# Patient Record
Sex: Male | Born: 1945 | Race: Black or African American | Hispanic: No | Smoking: Never smoker
Health system: Southern US, Community
[De-identification: ages and names within clinical notes are randomized; demographics above are authoritative.]

## PROBLEM LIST (undated history)

## (undated) ENCOUNTER — Emergency Department (HOSPITAL_COMMUNITY): Payer: Medicare Other | Source: Home / Self Care

## (undated) DIAGNOSIS — E118 Type 2 diabetes mellitus with unspecified complications: Secondary | ICD-10-CM

## (undated) DIAGNOSIS — N179 Acute kidney failure, unspecified: Secondary | ICD-10-CM

## (undated) DIAGNOSIS — E785 Hyperlipidemia, unspecified: Secondary | ICD-10-CM

## (undated) DIAGNOSIS — K219 Gastro-esophageal reflux disease without esophagitis: Secondary | ICD-10-CM

## (undated) DIAGNOSIS — I10 Essential (primary) hypertension: Secondary | ICD-10-CM

## (undated) DIAGNOSIS — E119 Type 2 diabetes mellitus without complications: Secondary | ICD-10-CM

## (undated) DIAGNOSIS — N189 Chronic kidney disease, unspecified: Secondary | ICD-10-CM

## (undated) HISTORY — DX: Hyperlipidemia, unspecified: E78.5

## (undated) HISTORY — DX: Gastro-esophageal reflux disease without esophagitis: K21.9

## (undated) HISTORY — PX: NO PAST SURGERIES: SHX2092

## (undated) HISTORY — PX: KIDNEY SURGERY: SHX687

## (undated) HISTORY — DX: Chronic kidney disease, unspecified: N18.9

## (undated) HISTORY — PX: LITHOTRIPSY: SUR834

---

## 1998-05-30 ENCOUNTER — Encounter: Admission: RE | Admit: 1998-05-30 | Discharge: 1998-08-28 | Payer: Self-pay | Admitting: Unknown Physician Specialty

## 1999-06-14 ENCOUNTER — Ambulatory Visit (HOSPITAL_COMMUNITY): Admission: RE | Admit: 1999-06-14 | Discharge: 1999-06-14 | Payer: Self-pay | Admitting: Unknown Physician Specialty

## 2003-08-20 ENCOUNTER — Encounter: Admission: RE | Admit: 2003-08-20 | Discharge: 2003-08-20 | Payer: Self-pay | Admitting: Nephrology

## 2003-08-20 ENCOUNTER — Encounter: Payer: Self-pay | Admitting: Nephrology

## 2009-04-25 ENCOUNTER — Emergency Department (HOSPITAL_COMMUNITY): Admission: EM | Admit: 2009-04-25 | Discharge: 2009-04-25 | Payer: Self-pay | Admitting: Emergency Medicine

## 2013-07-08 ENCOUNTER — Emergency Department (HOSPITAL_COMMUNITY)
Admission: EM | Admit: 2013-07-08 | Discharge: 2013-07-08 | Disposition: A | Discharge status: Home / Self Care | Payer: PRIVATE HEALTH INSURANCE | Attending: Emergency Medicine | Admitting: Emergency Medicine

## 2013-07-08 ENCOUNTER — Encounter (HOSPITAL_COMMUNITY): Payer: Self-pay | Admitting: Emergency Medicine

## 2013-07-08 DIAGNOSIS — R7309 Other abnormal glucose: Secondary | ICD-10-CM | POA: Insufficient documentation

## 2013-07-08 DIAGNOSIS — Z794 Long term (current) use of insulin: Secondary | ICD-10-CM | POA: Insufficient documentation

## 2013-07-08 DIAGNOSIS — R109 Unspecified abdominal pain: Secondary | ICD-10-CM | POA: Insufficient documentation

## 2013-07-08 DIAGNOSIS — E119 Type 2 diabetes mellitus without complications: Secondary | ICD-10-CM | POA: Insufficient documentation

## 2013-07-08 DIAGNOSIS — Z79899 Other long term (current) drug therapy: Secondary | ICD-10-CM | POA: Insufficient documentation

## 2013-07-08 DIAGNOSIS — R5381 Other malaise: Secondary | ICD-10-CM | POA: Insufficient documentation

## 2013-07-08 DIAGNOSIS — R5383 Other fatigue: Secondary | ICD-10-CM | POA: Insufficient documentation

## 2013-07-08 DIAGNOSIS — R531 Weakness: Secondary | ICD-10-CM

## 2013-07-08 DIAGNOSIS — R42 Dizziness and giddiness: Secondary | ICD-10-CM | POA: Insufficient documentation

## 2013-07-08 DIAGNOSIS — I10 Essential (primary) hypertension: Secondary | ICD-10-CM | POA: Insufficient documentation

## 2013-07-08 HISTORY — DX: Essential (primary) hypertension: I10

## 2013-07-08 HISTORY — DX: Type 2 diabetes mellitus without complications: E11.9

## 2013-07-08 LAB — CBC WITH DIFFERENTIAL/PLATELET
Basophils Relative: 0 % (ref 0–1)
Eosinophils Absolute: 0.2 10*3/uL (ref 0.0–0.7)
Eosinophils Relative: 2 % (ref 0–5)
HCT: 37.8 % — ABNORMAL LOW (ref 39.0–52.0)
Hemoglobin: 12.3 g/dL — ABNORMAL LOW (ref 13.0–17.0)
Lymphocytes Relative: 32 % (ref 12–46)
Monocytes Absolute: 1 10*3/uL (ref 0.1–1.0)
Monocytes Relative: 16 % — ABNORMAL HIGH (ref 3–12)
Neutro Abs: 3.3 10*3/uL (ref 1.7–7.7)
RBC: 4.27 MIL/uL (ref 4.22–5.81)
RDW: 13 % (ref 11.5–15.5)
WBC: 6.6 10*3/uL (ref 4.0–10.5)

## 2013-07-08 LAB — URINALYSIS, ROUTINE W REFLEX MICROSCOPIC
Ketones, ur: NEGATIVE mg/dL
Nitrite: NEGATIVE
pH: 6 (ref 5.0–8.0)

## 2013-07-08 LAB — COMPREHENSIVE METABOLIC PANEL
ALT: 29 U/L (ref 0–53)
CO2: 28 mEq/L (ref 19–32)
Creatinine, Ser: 1.68 mg/dL — ABNORMAL HIGH (ref 0.50–1.35)
Total Bilirubin: 0.5 mg/dL (ref 0.3–1.2)
Total Protein: 8.6 g/dL — ABNORMAL HIGH (ref 6.0–8.3)

## 2013-07-08 LAB — GLUCOSE, CAPILLARY
Glucose-Capillary: 156 mg/dL — ABNORMAL HIGH (ref 70–99)
Glucose-Capillary: 186 mg/dL — ABNORMAL HIGH (ref 70–99)

## 2013-07-08 LAB — URINE MICROSCOPIC-ADD ON

## 2013-07-08 LAB — LIPASE, BLOOD: Lipase: 19 U/L (ref 11–59)

## 2013-07-08 NOTE — ED Provider Notes (Signed)
History    CSN: [id number] Arrival date & time 07/08/13  1212  First MD Initiated Contact with Patient 07/08/13 1351     Chief Complaint  Patient presents with  . blood sugar drop   . Dizziness  . Weakness  . Abdominal Pain   (Consider location/radiation/quality/duration/timing/severity/associated sxs/prior Treatment) Patient is a 67 y.o. male presenting with weakness and abdominal pain. The history is provided by the patient. No language interpreter was used.  Weakness The current episode started in the past 7 days. Associated symptoms include abdominal pain and weakness. Pertinent negatives include no chest pain, chills, coughing, fever, headaches, nausea or vomiting. Associated symptoms comments: He has been having episodes generalized weakness, lightheadedness, feeling like he was going to pass out that are associated with blood sugar drops as low as 28. He went for evaluation by his PCP last week and his insulin dosing was decreased without any significant improvement in the frequency of blood sugar drops. He denies falls or injury. He has right sided abdominal discomfort but denies N, V, change in bowel habit. No fevers, dysuria..  Abdominal Pain Associated symptoms include abdominal pain and weakness. Pertinent negatives include no chest pain, chills, coughing, fever, headaches, nausea or vomiting.   Past Medical History  Diagnosis Date  . Diabetes mellitus without complication   . Hypertension    History reviewed. No pertinent past surgical history. No family history on file. History  Substance Use Topics  . Smoking status: Never Smoker   . Smokeless tobacco: Never Used  . Alcohol Use: No    Review of Systems  Constitutional: Negative for fever and chills.  HENT: Negative.   Respiratory: Negative.  Negative for cough and shortness of breath.   Cardiovascular: Negative.  Negative for chest pain.  Gastrointestinal: Positive for abdominal pain. Negative for nausea,  vomiting and diarrhea.  Genitourinary: Negative.  Negative for dysuria.  Musculoskeletal: Negative.   Skin: Negative.   Neurological: Positive for weakness. Negative for syncope and headaches.    Allergies  Bee venom  Home Medications   Current Outpatient Rx  Name  Route  Sig  Dispense  Refill  . haloperidol (HALDOL) 5 MG tablet   Oral   Take 5-10 mg by mouth 2 (two) times daily. 1 tab in am, 2 tabs in pm         . insulin aspart protamine- aspart (NOVOLOG MIX 70/30) (70-30) 100 UNIT/ML injection   Subcutaneous   Inject 28-33 Units into the skin 2 (two) times daily with a meal. 33u in am, 28u in pm         . lisinopril (PRINIVIL,ZESTRIL) 5 MG tablet   Oral   Take 5 mg by mouth daily.         . metoprolol (LOPRESSOR) 100 MG tablet   Oral   Take 100 mg by mouth 2 (two) times daily.         . pravastatin (PRAVACHOL) 40 MG tablet   Oral   Take 40 mg by mouth at bedtime.          BP 155/91  Pulse 72  Temp(Src) 97.9 F (36.6 C) (Oral)  Resp 18  SpO2 100% Physical Exam  Constitutional: He is oriented to person, place, and time. He appears well-developed and well-nourished.  HENT:  Head: Normocephalic.  Neck: Normal range of motion. Neck supple.  Cardiovascular: Normal rate and regular rhythm.   Pulmonary/Chest: Effort normal and breath sounds normal.  Abdominal: Soft. Bowel sounds are normal. There  is no tenderness. There is no rebound and no guarding.  Mildly tender right abdomen without guarding or rebound. No localization to upper or lower abdomen. BS positive.   Musculoskeletal: Normal range of motion.  Neurological: He is alert and oriented to person, place, and time.  Skin: Skin is warm and dry. No rash noted.  Psychiatric: He has a normal mood and affect.    ED Course  Procedures (including critical care time) Labs Reviewed  GLUCOSE, CAPILLARY - Abnormal; Notable for the following:    Glucose-Capillary 156 (*)    All other components within  normal limits  CBC WITH DIFFERENTIAL - Abnormal; Notable for the following:    Hemoglobin 12.3 (*)    HCT 37.8 (*)    Monocytes Relative 16 (*)    All other components within normal limits  COMPREHENSIVE METABOLIC PANEL - Abnormal; Notable for the following:    Glucose, Bld 159 (*)    BUN 25 (*)    Creatinine, Ser 1.68 (*)    Total Protein 8.6 (*)    GFR calc non Af Amer 40 (*)    GFR calc Af Amer 47 (*)    All other components within normal limits  URINALYSIS, ROUTINE W REFLEX MICROSCOPIC - Abnormal; Notable for the following:    APPearance CLOUDY (*)    Protein, ur 30 (*)    Leukocytes, UA TRACE (*)    All other components within normal limits  URINE MICROSCOPIC-ADD ON - Abnormal; Notable for the following:    Bacteria, UA FEW (*)    All other components within normal limits  URINE CULTURE  LIPASE, BLOOD   No results found. No diagnosis found. 1. Weakness 2. Diabetes Type II MDM  Dr. Effie Shy has seen this patient. Discussed decreasing insulin dosing, dietary education, close primary care f/u. Stable for discharge.    Arnoldo Hooker, PA-C 07/08/13 1553

## 2013-07-08 NOTE — ED Notes (Signed)
Pt here today c/o of intermittent blood sugar drops, weakness that has been going on for a couple weeks.  Pt also c/o abd pain that has been going on for month.  Pt's son feels that he needs better eval of meds and physical well being bc he doesn't feel pt's PCP is doing a good job with changing his meds due to frequent blood sugar level drops and pts weakness and near syncope couple of times in past few weeks.

## 2013-07-08 NOTE — ED Provider Notes (Signed)
[name] [name] is a 67 y.o. male who is here for erratic blood sugars. He has not been monitoring his carbohydrate intake. His in the care provider, decreased his insulin dosing, minimally by 2 units, each dose, last week. He denies fever, chills, nausea, vomiting. He has occasional during sedation. His right lateral abdominal wall.  Exam alert, calm, cooperative. He is lucid and interactive. He is not in respiratory distress.  Assessment: Erratic blood sugars likely secondary to nutritional indiscretion. Mild renal insufficiency without comparison last available.  Plan: He is given brief verbal instructions and written instructions on appropriate carbohydrate intake. We'll decrease his insulin dosing by 50% until he can see his PCP in 2 days.  Medical screening examination/treatment/procedure(s) were conducted as a shared visit with non-physician practitioner(s) and myself.  I personally evaluated the patient during the encounter  Flint Melter, MD 07/09/13 [phone]

## 2013-07-08 NOTE — ED Notes (Signed)
Bed:[facility]<BR> Expected date:<BR> Expected time:<BR> Means of arrival:<BR> Comments:<BR> [place]

## 2013-07-09 LAB — URINE CULTURE: Colony Count: NO GROWTH

## 2013-11-15 ENCOUNTER — Emergency Department (HOSPITAL_COMMUNITY)
Admission: EM | Admit: 2013-11-15 | Discharge: 2013-11-15 | Disposition: A | Discharge status: Home / Self Care | Payer: PRIVATE HEALTH INSURANCE | Attending: Emergency Medicine | Admitting: Emergency Medicine

## 2013-11-15 ENCOUNTER — Ambulatory Visit (INDEPENDENT_AMBULATORY_CARE_PROVIDER_SITE_OTHER): Payer: Medicare Other | Admitting: Emergency Medicine

## 2013-11-15 ENCOUNTER — Emergency Department (HOSPITAL_COMMUNITY): Payer: PRIVATE HEALTH INSURANCE

## 2013-11-15 ENCOUNTER — Encounter (HOSPITAL_COMMUNITY): Payer: Self-pay | Admitting: Emergency Medicine

## 2013-11-15 VITALS — BP 188/80 | HR 72 | Temp 98.8°F | Resp 24

## 2013-11-15 DIAGNOSIS — Z79899 Other long term (current) drug therapy: Secondary | ICD-10-CM | POA: Insufficient documentation

## 2013-11-15 DIAGNOSIS — R079 Chest pain, unspecified: Secondary | ICD-10-CM

## 2013-11-15 DIAGNOSIS — I10 Essential (primary) hypertension: Secondary | ICD-10-CM | POA: Insufficient documentation

## 2013-11-15 DIAGNOSIS — R0789 Other chest pain: Secondary | ICD-10-CM | POA: Insufficient documentation

## 2013-11-15 DIAGNOSIS — R0602 Shortness of breath: Secondary | ICD-10-CM | POA: Insufficient documentation

## 2013-11-15 DIAGNOSIS — Z794 Long term (current) use of insulin: Secondary | ICD-10-CM | POA: Insufficient documentation

## 2013-11-15 DIAGNOSIS — F29 Unspecified psychosis not due to a substance or known physiological condition: Secondary | ICD-10-CM | POA: Insufficient documentation

## 2013-11-15 DIAGNOSIS — E785 Hyperlipidemia, unspecified: Secondary | ICD-10-CM | POA: Insufficient documentation

## 2013-11-15 DIAGNOSIS — E119 Type 2 diabetes mellitus without complications: Secondary | ICD-10-CM

## 2013-11-15 DIAGNOSIS — M542 Cervicalgia: Secondary | ICD-10-CM | POA: Insufficient documentation

## 2013-11-15 DIAGNOSIS — R05 Cough: Secondary | ICD-10-CM

## 2013-11-15 LAB — POCT I-STAT TROPONIN I: Troponin i, poc: 0.02 ng/mL (ref 0.00–0.08)

## 2013-11-15 LAB — CBC
MCH: 29.4 pg (ref 26.0–34.0)
MCHC: 34.3 g/dL (ref 30.0–36.0)
Platelets: 217 10*3/uL (ref 150–400)
RBC: 4.05 MIL/uL — ABNORMAL LOW (ref 4.22–5.81)
RDW: 12.5 % (ref 11.5–15.5)

## 2013-11-15 LAB — BASIC METABOLIC PANEL
Calcium: 8.3 mg/dL — ABNORMAL LOW (ref 8.4–10.5)
GFR calc non Af Amer: 44 mL/min — ABNORMAL LOW (ref >90)
Sodium: 133 mEq/L — ABNORMAL LOW (ref 135–145)

## 2013-11-15 MED ORDER — ASPIRIN 81 MG PO CHEW
324.0000 mg | CHEWABLE_TABLET | Freq: Once | ORAL | Status: AC
  Administered 2013-11-15: 324 mg via ORAL

## 2013-11-15 MED ORDER — NITROGLYCERIN 0.4 MG SL SUBL
0.4000 mg | SUBLINGUAL_TABLET | Freq: Once | SUBLINGUAL | Status: AC
  Administered 2013-11-15: 0.4 mg via SUBLINGUAL

## 2013-11-15 NOTE — ED Provider Notes (Signed)
CSN: [id number]     Arrival date & time 11/15/13  1030 History   First MD Initiated Contact with Patient 11/15/13 1131     Chief Complaint  Patient presents with  . Chest Pain   (Consider location/radiation/quality/duration/timing/severity/associated sxs/prior Treatment) HPI Comments: Patient is a 67 year old male with a history of hyperlipidemia, hypertension, and diabetes mellitus who presents for chest pain with onset 8 PM yesterday evening. Patient states the chest pain has been constant since onset and located at his left breast. Pain is nonradiating and sharp in nature without any modifying factors. Symptoms preceded by shortness of breath and associated with pain on the left side of his neck. Patient denies associated fever, vision changes, jaw pain, cough, nausea or vomiting, numbness or tingling, and extremity weakness. FHx significant for ACS in father "in his 17's". Patient given 324mg  ASA and 1 SL nitro by EMS PTA; transferred from [facility].  Patient is a 67 y.o. male presenting with chest pain. The history is provided by the patient. No language interpreter was used.  Chest Pain Associated symptoms: shortness of breath   Associated symptoms: no fever, no nausea and not vomiting     Past Medical History  Diagnosis Date  . Diabetes mellitus without complication   . Hypertension    History reviewed. No pertinent past surgical history. No family history on file. History  Substance Use Topics  . Smoking status: Never Smoker   . Smokeless tobacco: Never Used  . Alcohol Use: No    Review of Systems  Constitutional: Negative for fever.  Respiratory: Positive for shortness of breath.   Cardiovascular: Positive for chest pain.  Gastrointestinal: Negative for nausea and vomiting.  Musculoskeletal: Positive for neck pain. Negative for neck stiffness.  All other systems reviewed and are negative.    Allergies  Bee venom  Home Medications   Current Outpatient Rx  Name   Route  Sig  Dispense  Refill  . amLODipine-benazepril (LOTREL) 10-20 MG per capsule   Oral   Take 1 capsule by mouth daily.         . haloperidol (HALDOL) 5 MG tablet   Oral   Take 5-10 mg by mouth 2 (two) times daily. 1 tab in am, 2 tabs in pm         . Insulin Detemir (LEVEMIR FLEXPEN) 100 UNIT/ML SOPN   Subcutaneous   Inject 32 Units into the skin every morning.         Marland Kitchen lisinopril (PRINIVIL,ZESTRIL) 5 MG tablet   Oral   Take 5 mg by mouth daily.         . metoprolol (LOPRESSOR) 100 MG tablet   Oral   Take 100 mg by mouth 2 (two) times daily.         . nitroGLYCERIN (NITROSTAT) 0.4 MG SL tablet   Sublingual   Place 0.4 mg under the tongue every 5 (five) minutes as needed for chest pain.         . Nutritional Supplements (DHEA PO)   Oral   Take 1 tablet by mouth daily.         Marland Kitchen OVER THE COUNTER MEDICATION   Oral   Take 1 tablet by mouth daily. diabetic nerve rejuvenator         . pravastatin (PRAVACHOL) 40 MG tablet   Oral   Take 40 mg by mouth every morning.           BP 153/77  Pulse 78  Temp(Src) 98  F (36.7 C) (Oral)  Resp 20  Wt 193 lb (87.544 kg)  SpO2 100%  Physical Exam  Nursing note and vitals reviewed. Constitutional: He is oriented to person, place, and time. He appears well-developed and well-nourished. No distress.  HENT:  Head: Normocephalic and atraumatic.  Mouth/Throat: Oropharynx is clear and moist. No oropharyngeal exudate.  Eyes: Conjunctivae and EOM are normal. Pupils are equal, round, and reactive to light. No scleral icterus.  Neck: Normal range of motion. Neck supple.  No carotid bruits appreciated b/l  Cardiovascular: Normal rate, regular rhythm, normal heart sounds and intact distal pulses.   Pulmonary/Chest: Effort normal and breath sounds normal. No respiratory distress. He has no wheezes. He has no rales. He exhibits no tenderness.  Abdominal: Soft. There is no tenderness. There is no rebound and no guarding.   Musculoskeletal: Normal range of motion.  Neurological: He is alert and oriented to person, place, and time.  Skin: Skin is warm and dry. No rash noted. He is not diaphoretic. No erythema. No pallor.  Psychiatric: He has a normal mood and affect. His behavior is normal.    ED Course  Procedures (including critical care time) Labs Review Labs Reviewed  CBC - Abnormal; Notable for the following:    RBC 4.05 (*)    Hemoglobin 11.9 (*)    HCT 34.7 (*)    All other components within normal limits  BASIC METABOLIC PANEL - Abnormal; Notable for the following:    Sodium 133 (*)    Glucose, Bld 370 (*)    Creatinine, Ser 1.56 (*)    Calcium 8.3 (*)    GFR calc non Af Amer 44 (*)    GFR calc Af Amer 51 (*)    All other components within normal limits  POCT I-STAT TROPONIN I   Imaging Review Dg Chest 2 View (if Patient Has Fever And/or Copd)  11/15/2013   CLINICAL DATA:  Chest pain, shortness of Breath, hypertension.  EXAM: CHEST - 2 VIEW  COMPARISON:  08/07/2012 by report only  FINDINGS: Blunting of the right lateral costophrenic angle which was described previously. Mild cardiomegaly. No focal infiltrate or overt edema. No pneumothorax. No effusion. Regional bones unremarkable.  IMPRESSION: Mild cardiomegaly.   Electronically Signed   By: Oley Balm M.D.   On: 11/15/2013 11:16    Date: 11/15/2013  Rate: 70  Rhythm: normal sinus rhythm  QRS Axis: left  Intervals: normal  ST/T Wave abnormalities: nonspecific T wave changes  Conduction Disutrbances:none  Narrative Interpretation: NSR with NS T wave changes; no STEMI  Old EKG Reviewed: unchanged I have personally reviewed and interpreted this EKG   MDM   1. Chest pain, atypical    Patient presents for chest pain that has been constant since 8pm yesterday evening. Patient transferred to ED today from [facility] for further evaluation of symptoms. Patient today is well and nontoxic appearing, hemodynamically stable, and afebrile. Risk  factors include DM and HTN. Cardiac work up today including EKG, CXR, and troponin are unremarkable. No electrolyte imbalance that would explain patient's symptoms.   Doubt ACS given atypical nature of symptoms, reassuring physical exam, and work up in ED today. Also doubt aortic dissection given hemodynamic stability, no known hx of CAD, and lack of mediastinal widening on CXR. Pain on physical exam appears to be MSK in nature as pain present also in L side of neck and worse with neck movement. Do not believe delta troponin indicated given that pain has been constant  for the last 16 hours. Patient stable for d/c today with cardiology follow up for further evaluation of symptoms and scheduling of outpatient stress test. Return precautions discussed and patient agreeable to plan with no unaddressed concerns.   Filed Vitals:   11/15/13 1037 11/15/13 1131  BP: 166/71 153/77  Pulse: 76 78  Temp: 98 F (36.7 C)   TempSrc: Oral   Resp: 20 20  Weight: 193 lb (87.544 kg)   SpO2: 100% 100%     Antony Madura, PA-C 11/19/13 1555

## 2013-11-15 NOTE — Progress Notes (Signed)
Subjective:  This chart was scribed for [name] Chris, MD by Carl Best, Medical Scribe. This patient was seen in Room 6 and the patient's care was started at 9:49 AM.   Patient ID: [name] [name], male    DOB: 02/06/1946, 67 y.o.   MRN: [id number]  HPI HPI Comments: [name] [name] is a 67 y.o. male with a history of DM and Hypertension who presents to the [facility] complaining of sharp, non-radiating, constant chest pain that started last night at 8 PM.  He states that the severity of his pain as decreased since last night and rates his pain at a 4/10 right now.  He denies SOB and diaphoresis as associated symptoms.  He denies having a history of MI.  He states that he is a non-smoker.  He denies experiencing this pain before.  He states that he is supposed to take baby Aspirin daily but states that he has stopped taking them.  He denies taking any Cialis or Viagra last night.     Past Medical History  Diagnosis Date  . Diabetes mellitus without complication   . Hypertension    No past surgical history on file. No family history on file. History   Social History  . Marital Status: Divorced    Spouse Name: N/A    Number of Children: N/A  . Years of Education: N/A   Occupational History  . Not on file.   Social History Main Topics  . Smoking status: Never Smoker   . Smokeless tobacco: Never Used  . Alcohol Use: No  . Drug Use: Not on file  . Sexual Activity: Not on file   Other Topics Concern  . Not on file   Social History Narrative  . No narrative on file   Allergies  Allergen Reactions  . Bee Venom Swelling   Current outpatient prescriptions:haloperidol (HALDOL) 5 MG tablet, Take 5-10 mg by mouth 2 (two) times daily. 1 tab in am, 2 tabs in pm, Disp: , Rfl: ;  insulin aspart protamine- aspart (NOVOLOG MIX 70/30) (70-30) 100 UNIT/ML injection, Inject 28-33 Units into the skin 2 (two) times daily with a meal. 33u in am, 28u in pm, Disp: , Rfl: ;   lisinopril (PRINIVIL,ZESTRIL) 5 MG tablet, Take 5 mg by mouth daily., Disp: , Rfl:  metoprolol (LOPRESSOR) 100 MG tablet, Take 100 mg by mouth 2 (two) times daily., Disp: , Rfl: ;  pravastatin (PRAVACHOL) 40 MG tablet, Take 40 mg by mouth at bedtime., Disp: , Rfl:    Review of Systems  Constitutional: Negative for diaphoresis.  Respiratory: Negative for shortness of breath.   Cardiovascular: Positive for chest pain.  All other systems reviewed and are negative.     Objective:  Physical Exam Physical Exam  Nursing note and vitals reviewed. Constitutional: He is oriented to person, place, and time. He appears well-developed and well-nourished. No distress.  HENT:  Head: Normocephalic.  Eyes: EOM are normal. Pupils are equal, round, and reactive to light.  Neck: Normal range of motion. Neck supple.  Cardiovascular: Normal rate, regular rhythm and normal heart sounds.  Exam reveals no gallop and no friction rub.  No murmur heard. Pulmonary/Chest: Effort normal and breath sounds normal. No respiratory distress. He has no wheezes. Rales in the left base.  Abdominal: Soft.  Musculoskeletal: No edema or swelling. Neurological: He is alert and oriented to person, place, and time.  Skin: Skin is warm and dry.   EKG interpreted by Dr.  Gladine Plude: T wave inversion in 1. AVL in V5 and V6. 2 mm ST elevation in V2 and V3.      Assessment & Plan:  Patient with chest pain since last night at 8 PM with abnormal EKG worrisome for an acute coronary event.  Will give 4 baby Aspirin.  IV started and O2 monitored and once IV started will give a trial of Nitroglycerin. Paramedics or here we'll transport to the emergency room for evaluation. There was no EKG  available for comparison .   I personally performed the services described in this documentation, which was scribed in my presence. The recorded information has been reviewed and is accurate.

## 2013-11-15 NOTE — ED Notes (Signed)
Pt states he is pain free unless he moves his neck.  Pt states when he moves his neck he has chest pain.

## 2013-11-15 NOTE — ED Notes (Signed)
Received pt from urgent care with c/o left sided chest pain that started last night at 2000 while trying to sleep. Pain does not radiate, reports shortness of breath prior to chest pain. Pt talking in complete sentences without difficulty. Pt had 324 mg of ASA and 1 nitro PTA.

## 2013-11-20 NOTE — ED Provider Notes (Signed)
Medical screening examination/treatment/procedure(s) were conducted as a shared visit with non-physician practitioner(s) and myself.  I personally evaluated the patient during the encounter.  EKG Interpretation    Date/Time:  Sunday November 15 2013 15:19:15 EST Ventricular Rate:  70 PR Interval:  184 QRS Duration: 81 QT Interval:  400 QTC Calculation: 432 R Axis:   -18 Text Interpretation:  Sinus rhythm Ventricular premature complex Probable left atrial enlargement Left ventricular hypertrophy Abnormal T, consider ischemia, lateral leads ED PHYSICIAN INTERPRETATION AVAILABLE IN [facility] Confirmed by TEST, RECORD ([id number]), editor CLAYTON  CCT  CETT, ROBIN (2) on 11/16/2013 4:32:23 PM             CP appeared reproducible with neck movement, and he was well appearing and low suspicion for ACS on my evaluation   Joya Gaskins, MD 11/20/13 0040

## 2014-04-02 ENCOUNTER — Encounter (HOSPITAL_COMMUNITY): Payer: Self-pay | Admitting: Emergency Medicine

## 2014-04-02 ENCOUNTER — Emergency Department (HOSPITAL_COMMUNITY)
Admission: EM | Admit: 2014-04-02 | Discharge: 2014-04-02 | Disposition: A | Discharge status: Home / Self Care | Payer: Medicare Other | Attending: Emergency Medicine | Admitting: Emergency Medicine

## 2014-04-02 DIAGNOSIS — N132 Hydronephrosis with renal and ureteral calculous obstruction: Secondary | ICD-10-CM

## 2014-04-02 DIAGNOSIS — Z794 Long term (current) use of insulin: Secondary | ICD-10-CM | POA: Insufficient documentation

## 2014-04-02 DIAGNOSIS — R319 Hematuria, unspecified: Secondary | ICD-10-CM | POA: Insufficient documentation

## 2014-04-02 DIAGNOSIS — N201 Calculus of ureter: Secondary | ICD-10-CM | POA: Insufficient documentation

## 2014-04-02 DIAGNOSIS — E119 Type 2 diabetes mellitus without complications: Secondary | ICD-10-CM | POA: Insufficient documentation

## 2014-04-02 DIAGNOSIS — I10 Essential (primary) hypertension: Secondary | ICD-10-CM | POA: Insufficient documentation

## 2014-04-02 DIAGNOSIS — Z79899 Other long term (current) drug therapy: Secondary | ICD-10-CM | POA: Insufficient documentation

## 2014-04-02 DIAGNOSIS — N1330 Unspecified hydronephrosis: Secondary | ICD-10-CM | POA: Insufficient documentation

## 2014-04-02 LAB — URINE MICROSCOPIC-ADD ON

## 2014-04-02 LAB — CBC WITH DIFFERENTIAL/PLATELET
BASOS ABS: 0 10*3/uL (ref 0.0–0.1)
BASOS PCT: 0 % (ref 0–1)
EOS ABS: 0.2 10*3/uL (ref 0.0–0.7)
Eosinophils Relative: 3 % (ref 0–5)
HCT: 33.1 % — ABNORMAL LOW (ref 39.0–52.0)
HEMOGLOBIN: 11.2 g/dL — AB (ref 13.0–17.0)
Lymphocytes Relative: 41 % (ref 12–46)
Lymphs Abs: 2.6 10*3/uL (ref 0.7–4.0)
MCH: 28.7 pg (ref 26.0–34.0)
MCHC: 33.8 g/dL (ref 30.0–36.0)
MCV: 84.9 fL (ref 78.0–100.0)
MONOS PCT: 15 % — AB (ref 3–12)
Monocytes Absolute: 0.9 10*3/uL (ref 0.1–1.0)
NEUTROS ABS: 2.6 10*3/uL (ref 1.7–7.7)
NEUTROS PCT: 41 % — AB (ref 43–77)
Platelets: 255 10*3/uL (ref 150–400)
RBC: 3.9 MIL/uL — ABNORMAL LOW (ref 4.22–5.81)
RDW: 12.8 % (ref 11.5–15.5)
WBC: 6.2 10*3/uL (ref 4.0–10.5)

## 2014-04-02 LAB — COMPREHENSIVE METABOLIC PANEL
ALBUMIN: 3.2 g/dL — AB (ref 3.5–5.2)
ALK PHOS: 80 U/L (ref 39–117)
ALT: 20 U/L (ref 0–53)
AST: 24 U/L (ref 0–37)
BUN: 31 mg/dL — ABNORMAL HIGH (ref 6–23)
CALCIUM: 8.5 mg/dL (ref 8.4–10.5)
CO2: 20 mEq/L (ref 19–32)
Chloride: 105 mEq/L (ref 96–112)
Creatinine, Ser: 2.51 mg/dL — ABNORMAL HIGH (ref 0.50–1.35)
GFR calc non Af Amer: 25 mL/min — ABNORMAL LOW (ref >90)
GFR, EST AFRICAN AMERICAN: 29 mL/min — AB (ref >90)
GLUCOSE: 211 mg/dL — AB (ref 70–99)
POTASSIUM: 4.4 meq/L (ref 3.7–5.3)
Sodium: 137 mEq/L (ref 137–147)
TOTAL PROTEIN: 7.6 g/dL (ref 6.0–8.3)
Total Bilirubin: 0.4 mg/dL (ref 0.3–1.2)

## 2014-04-02 LAB — URINALYSIS, ROUTINE W REFLEX MICROSCOPIC
Bilirubin Urine: NEGATIVE
GLUCOSE, UA: NEGATIVE mg/dL
Ketones, ur: NEGATIVE mg/dL
LEUKOCYTES UA: NEGATIVE
Nitrite: NEGATIVE
PH: 6 (ref 5.0–8.0)
Protein, ur: >300 mg/dL — AB
SPECIFIC GRAVITY, URINE: 1.016 (ref 1.005–1.030)
Urobilinogen, UA: 1 mg/dL (ref 0.0–1.0)

## 2014-04-02 LAB — LIPASE, BLOOD: Lipase: 34 U/L (ref 11–59)

## 2014-04-02 MED ORDER — HYDROCODONE-ACETAMINOPHEN 5-325 MG PO TABS: 1.0000 | ORAL_TABLET | Freq: Four times a day (QID) | ORAL | Status: DC | PRN

## 2014-04-02 NOTE — ED Provider Notes (Signed)
CSN: [id number]     Arrival date & time 04/02/14  1552 History   First MD Initiated Contact with Patient 04/02/14 1828     Chief Complaint  Patient presents with  . Abdominal Pain     (Consider location/radiation/quality/duration/timing/severity/associated sxs/prior Treatment) Patient is a 68 y.o. male presenting with abdominal pain. The history is provided by the patient.  Abdominal Pain Associated symptoms: hematuria   Associated symptoms: no chest pain, no dysuria, no fever and no shortness of breath    patient a [place] [facility] [place] for right sided abdominal pain for about a month. Patient had CT scan done yesterday with contrast without any sitting findings other than left-sided hydronephrosis. Scan was repeated again today without contrast that confirmed to ureteral stones distal ureter biggest one was 5 mm. With hydronephrosis. Patient's creatinine earlier in the week was 2.6. Patient without any left-sided abdominal pain. CT scan negative for any findings for pathology on the right side of the abdomen. Patient patient has been complaining over the past month of having the right-sided abdominal pain nausea no vomiting he thinks he has some abdominal swelling.  Past Medical History  Diagnosis Date  . Diabetes mellitus without complication   . Hypertension    History reviewed. No pertinent past surgical history. History reviewed. No pertinent family history. History  Substance Use Topics  . Smoking status: Never Smoker   . Smokeless tobacco: Never Used  . Alcohol Use: No    Review of Systems  Constitutional: Negative for fever.  HENT: Negative for congestion.   Eyes: Negative for redness.  Respiratory: Negative for shortness of breath.   Cardiovascular: Negative for chest pain.  Gastrointestinal: Positive for abdominal pain.  Genitourinary: Positive for hematuria. Negative for dysuria and flank pain.  Musculoskeletal: Negative for back pain.  Skin:  Negative for rash.  Neurological: Negative for headaches.  Hematological: Does not bruise/bleed easily.  Psychiatric/Behavioral: Negative for confusion.      Allergies  Bee venom  Home Medications   Prior to Admission medications   Medication Sig Start Date End Date Taking? Authorizing Provider  amLODipine-benazepril (LOTREL) 10-20 MG per capsule Take 1 capsule by mouth daily.   Yes Historical Provider, MD  esomeprazole (NEXIUM) 20 MG capsule Take 20 mg by mouth daily at 12 noon.   Yes Historical Provider, MD  haloperidol (HALDOL) 5 MG tablet Take 5-10 mg by mouth 2 (two) times daily. 1 tab in am, 2 tabs in pm   Yes Historical Provider, MD  Insulin Detemir (LEVEMIR FLEXPEN) 100 UNIT/ML SOPN Inject 37 Units into the skin every morning.    Yes Historical Provider, MD  lisinopril (PRINIVIL,ZESTRIL) 5 MG tablet Take 5 mg by mouth daily.   Yes Historical Provider, MD  metoprolol (LOPRESSOR) 100 MG tablet Take 100 mg by mouth 2 (two) times daily.   Yes Historical Provider, MD  nitroGLYCERIN (NITROSTAT) 0.4 MG SL tablet Place 0.4 mg under the tongue every 5 (five) minutes as needed for chest pain.   Yes Historical Provider, MD  polyethylene glycol (MIRALAX / GLYCOLAX) packet Take 17 g by mouth daily.   Yes Historical Provider, MD  pravastatin (PRAVACHOL) 40 MG tablet Take 40 mg by mouth every morning.    Yes Historical Provider, MD  HYDROcodone-acetaminophen (NORCO/VICODIN) 5-325 MG per tablet Take 1-2 tablets by mouth every 6 (six) hours as needed for moderate pain. 04/02/14   Shelda JakesScott W. Symiah Nowotny, MD   BP 156/82  Pulse 80  Temp(Src) 98.2  F (36.8 C) (Oral)  Resp 20  SpO2 100% Physical Exam  Nursing note and vitals reviewed. Constitutional: He is oriented to person, place, and time. He appears well-developed and well-nourished. No distress.  HENT:  Head: Normocephalic and atraumatic.  Eyes: Conjunctivae and EOM are normal. Pupils are equal, round, and reactive to light.  Neck: Normal  range of motion.  Cardiovascular: Normal rate, regular rhythm and normal heart sounds.   Pulmonary/Chest: Effort normal and breath sounds normal.  Abdominal: Soft. Bowel sounds are normal.  Musculoskeletal: Normal range of motion. He exhibits no edema.  Neurological: He is alert and oriented to person, place, and time. No cranial nerve deficit. He exhibits normal muscle tone. Coordination normal.    ED Course  Procedures (including critical care time) Labs Review Labs Reviewed  CBC WITH DIFFERENTIAL - Abnormal; Notable for the following:    RBC 3.90 (*)    Hemoglobin 11.2 (*)    HCT 33.1 (*)    Neutrophils Relative % 41 (*)    Monocytes Relative 15 (*)    All other components within normal limits  COMPREHENSIVE METABOLIC PANEL - Abnormal; Notable for the following:    Glucose, Bld 211 (*)    BUN 31 (*)    Creatinine, Ser 2.51 (*)    Albumin 3.2 (*)    GFR calc non Af Amer 25 (*)    GFR calc Af Amer 29 (*)    All other components within normal limits  URINALYSIS, ROUTINE W REFLEX MICROSCOPIC - Abnormal; Notable for the following:    APPearance CLOUDY (*)    Hgb urine dipstick MODERATE (*)    Protein, ur >300 (*)    All other components within normal limits  LIPASE, BLOOD  URINE MICROSCOPIC-ADD ON    Imaging Review No results found.   EKG Interpretation None      MDM   Final diagnoses:  Ureteral stone with hydronephrosis    Discuss with Dr. Kathrynn RunningManning from urology. Patient given the option for admission now over [place] [facility] and [facility] over the weekend. Or going home and following up with them in the opposite next week if the stone does not pass will probably require surgical procedure. We know that patient's creatinine has been in the 2.5 range for based on labs from his other doctor since Monday. No significant change there. In addition the patient has no symptoms of pain on the left side workup was entirely for right abdominal pain no obvious findings air.  Patient's findings of the kidney stones were coincidental. We do not all and there. Did have a CAT scan yesterday that raise suspicion for hydronephrosis had a repeat CAT scan today that confirmed the 25 mm stones distally in the right ureter with hydronephrosis. Patient has opted to go home and followup in the office. Patient nontoxic no acute distress.  CT scan gives no answers to the patient's right-sided abdominal pain or his concern for the swelling.  Shelda JakesScott W. Brayan Votaw, MD 04/02/14 2150

## 2014-04-02 NOTE — Discharge Instructions (Signed)
Kidney Stones Kidney stones (urolithiasis) are solid masses that form inside your kidneys. The intense pain is caused by the stone moving through the kidney, ureter, bladder, and urethra (urinary tract). When the stone moves, the ureter starts to spasm around the stone. The stone is usually passed in your pee (urine).  HOME CARE  Drink enough fluids to keep your pee clear or pale yellow. This helps to get the stone out.  Strain all pee through the provided strainer. Do not pee without peeing through the strainer, not even once. If you pee the stone out, catch it in the strainer. The stone may be as small as a grain of salt. Take this to your doctor. This will help your doctor figure out what you can do to try to prevent more kidney stones.  Only take medicine as told by your doctor.  Follow up with your doctor as told.  Get follow-up X-rays as told by your doctor. GET HELP IF: You have pain that gets worse even if you have been taking pain medicine. GET HELP RIGHT AWAY IF:   Your pain does not get better with medicine.  You have a fever or shaking chills.  Your pain increases and gets worse over 18 hours.  You have new belly (abdominal) pain.  You feel faint or pass out.  You are unable to pee. MAKE SURE YOU:   Understand these instructions.  Will watch your condition.  Will get help right away if you are not doing well or get worse. Document Released: 05/21/2008 Document Revised: 08/05/2013 Document Reviewed: 05/06/2013 [facility] Patient Information 2014 [place], [place].  Hydronephrosis Hydronephrosis is an abnormal enlargement of your kidney. It can affect one or both the kidneys. It results from the backward pressure of urine on the kidneys, when the flow of urine is blocked. Normally, the urine drains from the kidney through the urine tube (ureter), into a sac which holds the urine until urination (bladder). When the urinary flow is blocked, the urine collects above the  block. This causes an increase in the pressure inside the kidney, which in turn leads to its enlargement. The block can occur at the point where the kidney joins the ureter. Treatment depends on the cause and location of the block.  CAUSES  The causes of this condition include:  Birth defect of the kidney or ureter.  Kink at the point where the kidney joins the ureter.  Stones and blood clots in the kidney or ureter.  Cancer, injury, or infection of the ureter.  Scar tissue formation.  Backflow of urine (reflux).  Cancer of bladder or prostate gland.  Abnormality of the nerves or muscles of the kidney or ureter.  Lower part of the ureter protruding into the bladder (ureterocele).  Abnormal contractions of the bladder.  Both the kidneys can be affected during pregnancy. This is because the enlarging uterus presses on the ureters and blocks the flow of urine. SYMPTOMS  The symptoms depend on the location of the block. They also depend on how long the block has been present. You may feel pain on the affected side. Sometimes, you may not have any symptoms. There may be a dull ache or discomfort in the flank. The common symptoms are:  Flank pain.  Swelling of the abdomen.  Pain in the abdomen.  Nausea and vomiting.  Fever.  Pain while passing urine.  Urgency for urination.  Frequent or urgent urination.  Infection of the urinary tract. DIAGNOSIS  Your caregiver will  examine you after asking about your symptoms. You may be asked to do blood and urine tests. Your caregiver may order a special X-ray, ultrasound, or CT scan. Sometimes a rigid or flexible telescope (cystoscope) is used to view the site of the blockage.  TREATMENT  Treatment depends on the site, cause, and duration of the block. The goal of treatment is to remove the blockage. Your caregiver will plan the treatment based on your condition. The different types of treatment are:   Putting in a soft plastic tube  (ureteral stent) to connect the bladder with the kidney. This will help in draining the urine.  Putting in a soft tube (nephrostomy tube). This is placed through skin into the kidney. The trapped urine is drained out through the back. A plastic bag is attached to your skin to hold the urine that has drained out.  Antibiotics to treat or prevent infection.  Breaking down of the stone (lithotripsy). HOME CARE INSTRUCTIONS   It may take some time for the hydronephrosis to go away (resolve). Drink fluids as directed by your caregiver , and get a lot of rest.  If you have a drain in, your caregiver will give you directions about how to care for it. Be sure you understand these directions completely before you go home.  Take any antibiotics, pain medications, or other prescriptions exactly as prescribed.  Follow-up with your caregivers as directed. SEEK MEDICAL CARE IF:   You continue to have flank pain, nausea, or difficulty with urination.  You have any problem with any type of drainage device.  Your urine becomes cloudy or bloody. SEEK IMMEDIATE MEDICAL CARE IF:   You have severe flank and/or abdominal pain.  You develop vomiting and are unable to hold down fluids.  You develop a fever above 100.5 F (38.1 C), or as per your caregiver. MAKE SURE YOU:   Understand these instructions.  Will watch your condition.  Will get help right away if you are not doing well or get worse. Document Released: 09/30/2007 Document Revised: 02/25/2012 Document Reviewed: 11/16/2010 [facility]Exit[facility] Patient Information 2014 [place], [place].   As we discussed call urology on Monday for followup time. Return for a newer worse symptoms. Take pain medication as directed. It is very important that you followup urology.

## 2014-04-02 NOTE — ED Notes (Signed)
Pt reports right side abdominal pain x 1.5 months ago.  CT pelvis completed 04/02/14 reports two distal kidney stones, largest 5 mm, with left ureteral dilation and hydronephrosis.  Right kidney and ureter appears normal.

## 2014-04-02 NOTE — ED Notes (Signed)
Pt sent here by his doctor. Pt has been having abdominal pain, nausea, abdominal swelling. Pt sent here to been seen by a urologist. Pt had CT of abdomen and pelvis PTA and showed hydronephrosis.

## 2014-04-05 ENCOUNTER — Encounter (HOSPITAL_COMMUNITY)
Admission: AD | Disposition: A | Discharge status: Home / Self Care | Payer: Self-pay | Source: Ambulatory Visit | Attending: Urology

## 2014-04-05 ENCOUNTER — Ambulatory Visit (HOSPITAL_COMMUNITY): Payer: Medicare Other | Admitting: Anesthesiology

## 2014-04-05 ENCOUNTER — Encounter (HOSPITAL_COMMUNITY): Payer: Medicare Other | Admitting: Anesthesiology

## 2014-04-05 ENCOUNTER — Other Ambulatory Visit: Payer: Self-pay | Admitting: Urology

## 2014-04-05 ENCOUNTER — Ambulatory Visit (HOSPITAL_COMMUNITY)
Admission: AD | Admit: 2014-04-05 | Discharge: 2014-04-05 | Disposition: A | Discharge status: Home / Self Care | Payer: Medicare Other | Source: Ambulatory Visit | Attending: Urology | Admitting: Urology

## 2014-04-05 ENCOUNTER — Encounter (HOSPITAL_COMMUNITY): Payer: Self-pay | Admitting: Anesthesiology

## 2014-04-05 DIAGNOSIS — N189 Chronic kidney disease, unspecified: Secondary | ICD-10-CM | POA: Insufficient documentation

## 2014-04-05 DIAGNOSIS — N1330 Unspecified hydronephrosis: Secondary | ICD-10-CM | POA: Insufficient documentation

## 2014-04-05 DIAGNOSIS — Z79899 Other long term (current) drug therapy: Secondary | ICD-10-CM | POA: Insufficient documentation

## 2014-04-05 DIAGNOSIS — N289 Disorder of kidney and ureter, unspecified: Secondary | ICD-10-CM | POA: Diagnosis present

## 2014-04-05 DIAGNOSIS — E7800 Pure hypercholesterolemia, unspecified: Secondary | ICD-10-CM | POA: Insufficient documentation

## 2014-04-05 DIAGNOSIS — E119 Type 2 diabetes mellitus without complications: Secondary | ICD-10-CM | POA: Insufficient documentation

## 2014-04-05 DIAGNOSIS — F319 Bipolar disorder, unspecified: Secondary | ICD-10-CM | POA: Insufficient documentation

## 2014-04-05 DIAGNOSIS — I129 Hypertensive chronic kidney disease with stage 1 through stage 4 chronic kidney disease, or unspecified chronic kidney disease: Secondary | ICD-10-CM | POA: Insufficient documentation

## 2014-04-05 DIAGNOSIS — N182 Chronic kidney disease, stage 2 (mild): Secondary | ICD-10-CM

## 2014-04-05 DIAGNOSIS — N201 Calculus of ureter: Secondary | ICD-10-CM | POA: Insufficient documentation

## 2014-04-05 DIAGNOSIS — M109 Gout, unspecified: Secondary | ICD-10-CM | POA: Insufficient documentation

## 2014-04-05 DIAGNOSIS — Z91038 Other insect allergy status: Secondary | ICD-10-CM | POA: Insufficient documentation

## 2014-04-05 DIAGNOSIS — N132 Hydronephrosis with renal and ureteral calculous obstruction: Secondary | ICD-10-CM | POA: Diagnosis present

## 2014-04-05 DIAGNOSIS — K219 Gastro-esophageal reflux disease without esophagitis: Secondary | ICD-10-CM | POA: Insufficient documentation

## 2014-04-05 DIAGNOSIS — Z794 Long term (current) use of insulin: Secondary | ICD-10-CM | POA: Insufficient documentation

## 2014-04-05 HISTORY — PX: HOLMIUM LASER APPLICATION: SHX5852

## 2014-04-05 HISTORY — PX: CYSTOSCOPY WITH RETROGRADE PYELOGRAM, URETEROSCOPY AND STENT PLACEMENT: SHX5789

## 2014-04-05 LAB — GLUCOSE, CAPILLARY
Glucose-Capillary: 108 mg/dL — ABNORMAL HIGH (ref 70–99)
Glucose-Capillary: 105 mg/dL — ABNORMAL HIGH (ref 70–99)

## 2014-04-05 SURGERY — CYSTOURETEROSCOPY, WITH RETROGRADE PYELOGRAM AND STENT INSERTION
Anesthesia: General | Laterality: Left

## 2014-04-05 MED ORDER — SODIUM CHLORIDE 0.9 % IV SOLN: 250.0000 mL | INTRAVENOUS | Status: DC | PRN

## 2014-04-05 MED ORDER — IOHEXOL 300 MG/ML  SOLN
INTRAMUSCULAR | Status: DC | PRN
  Administered 2014-04-05: 9 mL via URETHRAL

## 2014-04-05 MED ORDER — KETAMINE HCL 10 MG/ML IJ SOLN
INTRAMUSCULAR | Status: DC | PRN
  Administered 2014-04-05: 30 mg via INTRAVENOUS

## 2014-04-05 MED ORDER — LACTATED RINGERS IV SOLN
INTRAVENOUS | Status: DC | PRN
  Administered 2014-04-05: 18:00:00 via INTRAVENOUS

## 2014-04-05 MED ORDER — ACETAMINOPHEN 325 MG PO TABS: 650.0000 mg | ORAL_TABLET | ORAL | Status: DC | PRN

## 2014-04-05 MED ORDER — CIPROFLOXACIN IN D5W 400 MG/200ML IV SOLN
INTRAVENOUS | Status: AC
  Filled 2014-04-05: qty 200

## 2014-04-05 MED ORDER — OXYCODONE HCL 5 MG PO TABS: 5.0000 mg | ORAL_TABLET | ORAL | Status: DC | PRN

## 2014-04-05 MED ORDER — PHENAZOPYRIDINE HCL 200 MG PO TABS: 200.0000 mg | ORAL_TABLET | Freq: Three times a day (TID) | ORAL | Status: DC | PRN

## 2014-04-05 MED ORDER — SODIUM CHLORIDE 0.9 % IJ SOLN: 3.0000 mL | Freq: Two times a day (BID) | INTRAMUSCULAR | Status: DC

## 2014-04-05 MED ORDER — MIDAZOLAM HCL 2 MG/2ML IJ SOLN
INTRAMUSCULAR | Status: AC
  Filled 2014-04-05: qty 2

## 2014-04-05 MED ORDER — FENTANYL CITRATE 0.05 MG/ML IJ SOLN
INTRAMUSCULAR | Status: AC
  Filled 2014-04-05: qty 2

## 2014-04-05 MED ORDER — SODIUM CHLORIDE 0.9 % IR SOLN
Status: DC | PRN
  Administered 2014-04-05: 3000 mL via INTRAVESICAL

## 2014-04-05 MED ORDER — ONDANSETRON HCL 4 MG/2ML IJ SOLN
INTRAMUSCULAR | Status: AC
  Filled 2014-04-05: qty 2

## 2014-04-05 MED ORDER — PROPOFOL 10 MG/ML IV BOLUS
INTRAVENOUS | Status: DC | PRN
  Administered 2014-04-05: 150 mg via INTRAVENOUS

## 2014-04-05 MED ORDER — FENTANYL CITRATE 0.05 MG/ML IJ SOLN
INTRAMUSCULAR | Status: DC | PRN
  Administered 2014-04-05: 100 ug via INTRAVENOUS

## 2014-04-05 MED ORDER — SUCCINYLCHOLINE CHLORIDE 20 MG/ML IJ SOLN
INTRAMUSCULAR | Status: DC | PRN
  Administered 2014-04-05: 100 mg via INTRAVENOUS

## 2014-04-05 MED ORDER — 0.9 % SODIUM CHLORIDE (POUR BTL) OPTIME
TOPICAL | Status: DC | PRN
  Administered 2014-04-05: 1000 mL

## 2014-04-05 MED ORDER — PROMETHAZINE HCL 25 MG/ML IJ SOLN: 6.2500 mg | INTRAMUSCULAR | Status: DC | PRN

## 2014-04-05 MED ORDER — ACETAMINOPHEN 650 MG RE SUPP
650.0000 mg | RECTAL | Status: DC | PRN
  Filled 2014-04-05: qty 1

## 2014-04-05 MED ORDER — MIDAZOLAM HCL 5 MG/5ML IJ SOLN
INTRAMUSCULAR | Status: DC | PRN
  Administered 2014-04-05: 2 mg via INTRAVENOUS

## 2014-04-05 MED ORDER — ONDANSETRON HCL 4 MG/2ML IJ SOLN
INTRAMUSCULAR | Status: DC | PRN
  Administered 2014-04-05: 4 mg via INTRAVENOUS

## 2014-04-05 MED ORDER — PROPOFOL 10 MG/ML IV BOLUS
INTRAVENOUS | Status: AC
  Filled 2014-04-05: qty 20

## 2014-04-05 MED ORDER — CIPROFLOXACIN IN D5W 400 MG/200ML IV SOLN
400.0000 mg | INTRAVENOUS | Status: AC
  Administered 2014-04-05: 400 mg via INTRAVENOUS

## 2014-04-05 MED ORDER — CIPROFLOXACIN HCL 500 MG PO TABS: 500.0000 mg | ORAL_TABLET | Freq: Two times a day (BID) | ORAL | Status: DC

## 2014-04-05 MED ORDER — FENTANYL CITRATE 0.05 MG/ML IJ SOLN: 25.0000 ug | INTRAMUSCULAR | Status: DC | PRN

## 2014-04-05 MED ORDER — SODIUM CHLORIDE 0.9 % IJ SOLN: 3.0000 mL | INTRAMUSCULAR | Status: DC | PRN

## 2014-04-05 SURGICAL SUPPLY — 21 items
BAG URO CATCHER STRL LF (DRAPE) ×3 IMPLANT
BASKET ZERO TIP NITINOL 2.4FR (BASKET) ×3 IMPLANT
CATH URET 5FR 28IN OPEN ENDED (CATHETERS) ×3 IMPLANT
CLOTH BEACON ORANGE TIMEOUT ST (SAFETY) ×3 IMPLANT
DRAPE CAMERA CLOSED 9X96 (DRAPES) ×3 IMPLANT
FIBER LASER FLEXIVA 365 (UROLOGICAL SUPPLIES) ×3 IMPLANT
GLOVE BIO SURGEON STRL SZ7.5 (GLOVE) ×3 IMPLANT
GLOVE BIOGEL PI IND STRL 6 (GLOVE) ×1 IMPLANT
GLOVE BIOGEL PI IND STRL 7.5 (GLOVE) ×1 IMPLANT
GLOVE BIOGEL PI INDICATOR 6 (GLOVE) ×2
GLOVE BIOGEL PI INDICATOR 7.5 (GLOVE) ×2
GLOVE SURG SS PI 8.0 STRL IVOR (GLOVE) ×3 IMPLANT
GOWN PREVENTION PLUS XLARGE (GOWN DISPOSABLE) ×3 IMPLANT
GOWN STRL REUS W/ TWL XL LVL3 (GOWN DISPOSABLE) ×1 IMPLANT
GOWN STRL REUS W/TWL XL LVL3 (GOWN DISPOSABLE) ×5 IMPLANT
MANIFOLD NEPTUNE II (INSTRUMENTS) ×3 IMPLANT
PACK CYSTO (CUSTOM PROCEDURE TRAY) ×3 IMPLANT
SHEATH ACCESS URETERAL 38CM (SHEATH) ×3 IMPLANT
STENT CONTOUR 6FRX26X.038 (STENTS) ×3 IMPLANT
TUBING CONNECTING 10 (TUBING) ×2 IMPLANT
TUBING CONNECTING 10' (TUBING) ×1

## 2014-04-05 NOTE — Brief Op Note (Signed)
04/05/2014  7:04 PM  PATIENT:  [name] [name]  68 y.o. male  PRE-OPERATIVE DIAGNOSIS:  left ureteral stone  POST-OPERATIVE DIAGNOSIS:  left ureteral stone  PROCEDURE:  Procedure(s): CYSTOSCOPY WITH RETROGRADE PYELOGRAM, POSSIBLE LEFT URETEROSCOPY AND LEFT STENT PLACEMENT (Left) HOLMIUM LASER APPLICATION (Left)  SURGEON:  Surgeon(s) and Role:    * Bjorn PippinJohn Taleah Bellantoni, MD - Primary  PHYSICIAN ASSISTANT:   ASSISTANTS: none   ANESTHESIA:   general  EBL:     BLOOD ADMINISTERED:none  DRAINS: left 6 x 26 JJ stent   LOCAL MEDICATIONS USED:  NONE  SPECIMEN:  Source of Specimen:  ureteral stone  DISPOSITION OF SPECIMEN:  to family  COUNTS:  YES  TOURNIQUET:  * No tourniquets in log *  DICTATION: .Other Dictation: Dictation Number [id number]  PLAN OF CARE: Discharge to home after PACU  PATIENT DISPOSITION:  PACU - hemodynamically stable.   Delay start of Pharmacological VTE agent (>24hrs) due to surgical blood loss or risk of bleeding: not applicable

## 2014-04-05 NOTE — Progress Notes (Signed)
Unable to reach pharmacy technician prior to pt going to surgery.

## 2014-04-05 NOTE — Transfer of Care (Signed)
Immediate Anesthesia Transfer of Care Note  Patient: [name] [name]  Procedure(s) Performed: Procedure(s) (LRB): CYSTOSCOPY WITH RETROGRADE PYELOGRAM, POSSIBLE LEFT URETEROSCOPY AND LEFT STENT PLACEMENT (Left) HOLMIUM LASER APPLICATION (Left)  Patient Location: PACU  Anesthesia Type: General  Level of Consciousness: sedated, patient cooperative and responds to stimulation  Airway & Oxygen Therapy: Patient Spontanous Breathing and Patient connected to face mask oxgen  Post-op Assessment: Report given to PACU RN and Post -op Vital signs reviewed and stable  Post vital signs: Reviewed and stable  Complications: No apparent anesthesia complications

## 2014-04-05 NOTE — Anesthesia Preprocedure Evaluation (Signed)
Anesthesia Evaluation  Patient identified by MRN, date of birth, ID band Patient awake    Reviewed: Allergy & Precautions, H&P , NPO status , Patient's Chart, lab work & pertinent test results  Airway Mallampati: II TM Distance: >3 FB Neck ROM: Full    Dental no notable dental hx.    Pulmonary neg pulmonary ROS,  breath sounds clear to auscultation  Pulmonary exam normal       Cardiovascular hypertension, Pt. on medications and Pt. on home beta blockers Rhythm:Regular Rate:Normal     Neuro/Psych PSYCHIATRIC DISORDERS negative neurological ROS     GI/Hepatic Neg liver ROS, GERD-  Medicated,  Endo/Other  negative endocrine ROSdiabetes, Type 1, Insulin Dependent  Renal/GU Renal InsufficiencyRenal disease  negative genitourinary   Musculoskeletal negative musculoskeletal ROS (+)   Abdominal   Peds negative pediatric ROS (+)  Hematology negative hematology ROS (+)   Anesthesia Other Findings   Reproductive/Obstetrics negative OB ROS                           Anesthesia Physical Anesthesia Plan  ASA: III  Anesthesia Plan: General   Post-op Pain Management:    Induction: Intravenous  Airway Management Planned: Oral ETT  Additional Equipment:   Intra-op Plan:   Post-operative Plan: Extubation in OR  Informed Consent: I have reviewed the patients History and Physical, chart, labs and discussed the procedure including the risks, benefits and alternatives for the proposed anesthesia with the patient or authorized representative who has indicated his/her understanding and acceptance.   Dental advisory given  Plan Discussed with: CRNA  Anesthesia Plan Comments:         Anesthesia Quick Evaluation

## 2014-04-05 NOTE — Anesthesia Postprocedure Evaluation (Signed)
  Anesthesia Post-op Note  Patient: [name] [name]  Procedure(s) Performed: Procedure(s) (LRB): CYSTOSCOPY WITH RETROGRADE PYELOGRAM, POSSIBLE LEFT URETEROSCOPY AND LEFT STENT PLACEMENT (Left) HOLMIUM LASER APPLICATION (Left)  Patient Location: PACU  Anesthesia Type: General  Level of Consciousness: awake and alert   Airway and Oxygen Therapy: Patient Spontanous Breathing  Post-op Pain: mild  Post-op Assessment: Post-op Vital signs reviewed, Patient's Cardiovascular Status Stable, Respiratory Function Stable, Patent Airway and No signs of Nausea or vomiting  Last Vitals:  Filed Vitals:   04/05/14 2045  BP:   Pulse:   Temp: 36.4 C  Resp:     Post-op Vital Signs: stable   Complications: No apparent anesthesia complications

## 2014-04-05 NOTE — Discharge Instructions (Signed)
Ureteral Stent Implantation, Care After Refer to this sheet in the next few weeks. These instructions provide you with information on caring for yourself after your procedure. Your health care provider may also give you more specific instructions. Your treatment has been planned according to current medical practices, but problems sometimes occur. Call your health care provider if you have any problems or questions after your procedure. WHAT TO EXPECT AFTER THE PROCEDURE You should be back to normal activity within 48 hours after the procedure. Nausea and vomiting may occur and are commonly the result of anesthesia. It is common to experience sharp pain in the back or lower abdomen and penis with voiding. This is caused by movement of the ends of the stent with the act of urinating.It usually goes away within minutes after you have stopped urinating. HOME CARE INSTRUCTIONS Make sure to drink plenty of fluids. You may have small amounts of bleeding, causing your urine to be red. This is normal. Certain movements may trigger pain or a feeling that you need to urinate. You may be given medications to prevent infection or bladder spasms. Be sure to take all medications as directed. Only take over-the-counter or prescription medicines for pain, discomfort, or fever as directed by your health care provider. Do not take aspirin, as this can make bleeding worse. Your stent will be left in until the blockage is resolved. This may take 2 weeks or longer, depending on the reason for stent implantation. You may have an X-ray exam to make sure your ureter is open and that the stent has not moved out of position (migrated). The stent can be removed by your health care provider in the office. Medications may be given for comfort while the stent is being removed. Be sure to keep all follow-up appointments so your health care provider can check that you are healing properly. SEEK MEDICAL CARE IF:  You experience  increasing pain.  Your pain medication is not working. SEEK IMMEDIATE MEDICAL CARE IF:  Your urine is dark red or has blood clots.  You are leaking urine (incontinent).  You have a fever, chills, feeling sick to your stomach (nausea), or vomiting.  Your pain is not relieved by pain medication.  The end of the stent comes out of the urethra.  You are unable to urinate. Document Released: 08/05/2013 Document Reviewed: 08/05/2013 [facility]ExitCare Patient Information 2014 [place]ExitCare, [place]LLC.  Lithotripsy, Care After Refer to this sheet in the next few weeks. These instructions provide you with information on caring for yourself after your procedure. Your health care provider may also give you more specific instructions. Your treatment has been planned according to current medical practices, but problems sometimes occur. Call your health care provider if you have any problems or questions after your procedure. WHAT TO EXPECT AFTER THE PROCEDURE   Your urine may have a red tinge for a few days after treatment. Blood loss is usually minimal.  You may have soreness in the back or flank area. This usually goes away after a few days. The procedure can cause blotches or bruises on the back where the pressure wave enters the skin. These marks usually cause only minimal discomfort and should disappear in a short time.  Stone fragments should begin to pass within 24 hours of treatment. However, a delayed passage is not unusual.  You may have pain, discomfort, and feel sick to your stomach (nauseated) when the crushed fragments of stone are passed down the tube from the kidney to the  bladder. Stone fragments can pass soon after the procedure and may last for up to 4 8 weeks.  A small number of patients may have severe pain when stone fragments are not able to pass, which leads to an obstruction.  If your stone is greater than 1 inch (2.5 cm) in diameter or if you have multiple stones that have a combined  diameter greater than 1 inch (2.5 cm), you may require more than one treatment.  If you had a stent placed prior to your procedure, you may experience some discomfort, especially during urination. You may experience the pain or discomfort in your flank or back, or you may experience a sharp pain or discomfort at the base of your penis or in your lower abdomen. The discomfort usually lasts only a few minutes after urinating. HOME CARE INSTRUCTIONS   Rest at home until you feel your energy improving.  Only take over-the-counter or prescription medicines for pain, discomfort, or fever as directed by your health care provider. Depending on the type of lithotripsy, you may need to take antibiotics and anti-inflammatory medicines for a few days.  Drink enough water and fluids to keep your urine clear or pale yellow. This helps "flush" your kidneys. It helps pass any remaining pieces of stone and prevents stones from coming back.  Most people can resume daily activities within 1 2 days after standard lithotripsy. It can take longer to recover from laser and percutaneous lithotripsy.  If the stones are in your urinary system, you may be asked to strain your urine at home to look for stones. Any stones that are found can be sent to a medical lab for examination.  Visit your health care provider for a follow-up appointment in a few weeks. Your doctor may remove your stent if you have one. Your health care provider will also check to see whether stone particles still remain. SEEK MEDICAL CARE IF:   Your pain is not relieved by medicine.  You have a lasting nauseous feeling.  You feel there is too much blood in the urine.  You develop persistent problems with frequent or painful urination that does not at least partially improve after 2 days following the procedure.  You have a congested cough.  You feel lightheaded.  You develop a rash or any other signs that might suggest an allergic  problem.  You develop any reaction or side effects to your medicine(s). SEEK IMMEDIATE MEDICAL CARE IF:   You experience severe back or flank pain or both.  You see nothing but blood when you urinate.  You cannot pass any urine at all.  You have a fever or shaking chills.  You develop shortness of breath, difficulty breathing, or chest pain.  You develop vomiting that will not stop after 6 8 hours.  You have a fainting episode. Document Released: 12/23/2007 Document Revised: 09/23/2013 Document Reviewed: 06/18/2013 [facility]Exit[facility] Patient Information 2014 [place]Care, [place]LLC.

## 2014-04-05 NOTE — H&P (Signed)
1. Calculus of left ureter (592.1)  2. Hydronephrosis, left (591)  3. Pyuria (791.9)  4. Renal insufficiency (593.9)  History of Present Illness     [name] is a 68 yo BM sent from the ER for left ureteral stones seen on CT on 4/16 and 17.  He has had some pain for about 6 weeks.    He was seen initially for RUQ pain but he has had no left sided pain.   He has had some hematuria.  He has had no dysuria.  His pain was severe and is still present but not as bad.  He has had no nausea.  His Cr is up to 2.51 from 1.56 in 11/14.  He has two stones at the level of the left iliacs on CT.  He has no prior history of stones or UTI's.  He has had no prior GU surgery.   Past Medical History Problems   1. History of diabetes mellitus (V12.29)  2. History of esophageal reflux (V12.79)  3. History of gout (V12.29)  4. History of hypercholesterolemia (V12.29)  5. History of hypertension (V12.59)  6. History of renal failure (V13.09)  7. History of schizophrenia (V11.0)  Surgical History Problems   1. History of No Surgical Problems  Current Meds  1. Amlodipine Besy-Benazepril HCl - 10-20 MG Oral Capsule;  Therapy: (Recorded:20Apr2015) to Recorded  2. Haloperidol 5 MG Oral Tablet;  Therapy: (Recorded:20Apr2015) to Recorded  3. Hydrocodone-Acetaminophen 5-325 MG Oral Tablet;  Therapy: (Recorded:20Apr2015) to Recorded  4. Levemir FlexPen SOLN;  Therapy: (Recorded:20Apr2015) to Recorded  5. Lisinopril 10 MG Oral Tablet;  Therapy: (Recorded:20Apr2015) to Recorded  6. Metoprolol Tartrate 100 MG Oral Tablet;  Therapy: (Recorded:20Apr2015) to Recorded  7. MiraLax Oral Powder;  Therapy: (Recorded:20Apr2015) to Recorded  8. NexIUM 20 MG Oral Capsule Delayed Release;  Therapy: (Recorded:20Apr2015) to Recorded  9. Nitroglycerin 0.4 MG SUBL;  Therapy: (Recorded:20Apr2015) to Recorded  10. Pravastatin Sodium 40 MG Oral Tablet;   Therapy: (Recorded:20Apr2015) to Recorded  Allergies Medication    1. No Known Drug Allergies Non-Medication   2. Bee sting  Family History Problems   1. Family history of Death of family member : Mother, Father  2. Family history of acute myocardial infarction (V17.3) : Father  3. Family history of stroke (V17.1) : Mother  Social History Problems    Denied: History of Alcohol use   Divorced   Never smoker   Number of children   3 sons  Review of Systems Genitourinary, constitutional, skin, eye, otolaryngeal, hematologic/lymphatic, cardiovascular, pulmonary, endocrine, musculoskeletal, gastrointestinal, neurological and psychiatric system(s) were reviewed and pertinent findings if present are noted.  Genitourinary: urinary frequency, feelings of urinary urgency and nocturia (q20 min since the pain started. ).  Gastrointestinal: nausea, heartburn and constipation.  Constitutional: feeling tired (fatigue) and recent 4 lb weight loss.  Integumentary: pruritus.  Eyes: blurred vision.  Hematologic/Lymphatic: swollen glands.  Cardiovascular: chest pain and leg swelling.  Respiratory: shortness of breath.  Endocrine: polydipsia.  Neurological: headache and dizziness.    Vitals Vital Signs [Data Includes: Last 1 Day]  Recorded: 20Apr2015 01:23PM  Height: 5 ft 10.5 in Weight: 195 lb  BMI Calculated: 27.58 BSA Calculated: 2.08 Blood Pressure: 131 / 73 Temperature: 97.6 F Heart Rate: 72  Physical Exam Constitutional: Well nourished and well developed . No acute distress.  ENT:. The ears and nose are normal in appearance.  Neck: The appearance of the neck is normal and no neck mass is  present.  Pulmonary: No respiratory distress and normal respiratory rhythm and effort.  Cardiovascular: Heart rate and rhythm are normal . No peripheral edema.  Abdomen: No masses are palpated. Mild tenderness in the RUQ is present and tenderness in the LUQ is present. No CVA tenderness. No hernias are palpable. No hepatosplenomegaly noted.  Lymphatics: The  supraclavicular, femoral and inguinal nodes are not enlarged or tender.  Skin: Normal skin turgor, no visible rash and no visible skin lesions.  Neuro/Psych:. Mood and affect are appropriate.    Results/Data Urine [Data Includes: Last 1 Day]   20Apr2015  COLOR YELLOW   APPEARANCE CLEAR   SPECIFIC GRAVITY 1.020   pH 5.5   GLUCOSE NEG mg/dL  BILIRUBIN NEG   KETONE TRACE mg/dL  BLOOD SMALL   PROTEIN > 300 mg/dL  UROBILINOGEN 1 mg/dL  NITRITE NEG   LEUKOCYTE ESTERASE NEG   SQUAMOUS EPITHELIAL/HPF NONE SEEN   WBC 7-10 WBC/hpf  RBC 0-2 RBC/hpf  BACTERIA MANY   CRYSTALS NONE SEEN   CASTS Hyaline casts noted    Old records or history reviewed: ER notes reviewed.  The following images/tracing/specimen were independently visualized:  CT films and reports reviewed. KUB today shows no obvious stone s. Apart from some degenerative changes, he has normal bones and soft tissues.  The following clinical lab reports were reviewed:  UA and hospital labs reviewed.    Assessment Assessed   1. Pyuria (791.9)  2. Calculus of left ureter (592.1)  3. Hydronephrosis, left (591)  4. Renal insufficiency (593.9)   He has ARI from a left mid ureteral stone with obstruction.  The stones are possibly uric acid and don't show on a KUB.   Plan  Calculus of left ureter   1. Follow-up Schedule Surgery Office  Follow-up  Status: Hold For - Appointment   Requested for: 20Apr2015  2. KUB; Status:Resulted - Requires Verification;   Done: 20Apr2015 12:00AM Health Maintenance   3. UA With REFLEX; [Do Not Release]; Status:Resulted - Requires Verification;   Done:  20Apr2015 12:55PM   With the ARI and pain, I have recommended that we proceed with cystoscopy, left RTG and possible ureteroscopy and stenting.  I reviewed the risks of bleeding,infection, ureteral injury, need for a stent or secondary procedure, thrombotic events and anesthetic risks.    URINE CULTURE; Status:In Progress - Specimen/Data Collected;    Done: 20Apr2015 Perform:[facility]; Due:22Apr2015; Marked Important; Last Updated ZO:XWRUEAVBy:Temoche, Marthenia RollingHugo; 04/05/2014 2:00:51 PM;Ordered; Today;   WUJ:WJXBJYFor:Pyuria; Ordered NW:GNFAOBy:Tametha Banning;

## 2014-04-06 ENCOUNTER — Encounter (HOSPITAL_COMMUNITY): Payer: Self-pay | Admitting: Urology

## 2014-04-06 NOTE — Op Note (Signed)
NAMRockey Situ:  [name], [name]               ACCOUNT NO.:  [id number]  MEDICAL RECORD NO.:  [id number]  LOCATION:  [facility]                         [facility]  PHYSICIAN:  Excell SeltzerJohn J. Annabell [name], M.D.    DATE OF BIRTH:  [birth year]  DATE OF PROCEDURE:  04/05/2014 DATE OF DISCHARGE:  04/05/2014                              OPERATIVE REPORT   PROCEDURES:  Cystoscopy ,left retrograde pyelogram with interpretation, left ureteroscopic stone extraction with holmium lasertripsy and placement of left double-J stent.  PREOPERATIVE DIAGNOSIS:  Left midureteral stone.  POSTOPERATIVE DIAGNOSIS:  Left midureteral stone.  SURGEON:  Excell SeltzerJohn J. Annabell [name], M.D.  ANESTHESIA:  General.  SPECIMEN:  Stone fragments.  DRAINS:  A 6-French 26-cm double-J stent.  BLOOD LOSS:  Minimal.  COMPLICATIONS:  None.  INDICATIONS:  [name] he is a 68 year old African American male who was sent from the emergency room with a creatinine of 2.56 and left hydronephrosis from two left midureteral stones approximately 5 mm in size.  Because of the renal insufficiency and hydronephrosis, it was felt that ureteroscopy was indicated.  FINDINGS AND PROCEDURE:  He was given Cipro.  He was taken to the operating room where general anesthetic was induced.  He was placed in lithotomy position and fitted with PAS hose.  Perineum and genitalia were prepped with Betadine solution.  He was draped in usual sterile fashion.  Cystoscopy was performed using a 22-French scope and 12-degree lens. Examination revealed the normal urethra.  The external sphincter was intact.  The prostatic urethra was short without significant obstruction, but there was little bit of elevation of the bladder neck. The bladder wall had mild trabeculation with no tumors, stones or inflammation noted and ureteral orifices were unremarkable.  The left ureteral orifice was cannulated with 5-French open-end catheter and contrast was instilled.  Left retrograde pyelogram  revealed a normal distal ureter and to approximately at the level of the x-ray, there was a narrowing and filling defect consistent with a stone.  Proximal to this, there was mild dilation.  A guidewire was then passed through the open-end catheter to the kidney. There were some resistance at the level of stone.  The open-end catheter was removed followed by the cystoscope.  A 12-French introducer sheath inner core dilator was then passed over the wire to the level of stone, it would not bypass the stone.  At this point, a 6.4-French rigid ureteroscope was passed alongside the wire up to the level of the stone.  There were inflammatory changes and mild stricturing distal to the stone and the stone appeared to be impacted in the anterior medial aspect of the ureter.  A 365-micron holmium laser fiber with settings of 0.5 watts and 10 hertz was then used to fragment the stone into manageable fragments and the tip of the laser was used to pick the stone out of the wall of the ureter.  Once the stone had been adequately fragmented, the residual sizable fragments were retrieved, they were quite small.  The remainder of the stone had been turned to dust.  At this point, the scope was advanced proximal to the level of stone impaction, no significant residual fragments were seen.  The scope was then removed.  The cystoscope was reinserted over the wire and a 6-French 26-cm double-J stent without string was then passed to the kidney under fluoroscopic guidance.  The wire was removed leaving good coil in the kidney and a good coil in the bladder.  The additional stone fragments were then evacuated from the bladder where they had flushed.  These were collected for eventual analysis and will be given to the family to bring to the office.  The patient's bladder was drained.  The cystoscope was removed.  He was taken down from lithotomy position.  His anesthetic was reversed.  He was moved  to the recovery room in stable condition.  There were no complications.     Excell SeltzerJohn J. Annabell [name], M.D.     JJW/MEDQ  D:  04/05/2014  T:  04/06/2014  Job:  [id number]

## 2014-04-28 ENCOUNTER — Emergency Department (HOSPITAL_COMMUNITY)
Admission: EM | Admit: 2014-04-28 | Discharge: 2014-04-28 | Disposition: A | Discharge status: Home / Self Care | Payer: Medicare Other | Attending: Emergency Medicine | Admitting: Emergency Medicine

## 2014-04-28 ENCOUNTER — Emergency Department (HOSPITAL_COMMUNITY): Payer: Medicare Other

## 2014-04-28 ENCOUNTER — Encounter (HOSPITAL_COMMUNITY): Payer: Self-pay | Admitting: Emergency Medicine

## 2014-04-28 DIAGNOSIS — S79919A Unspecified injury of unspecified hip, initial encounter: Secondary | ICD-10-CM | POA: Insufficient documentation

## 2014-04-28 DIAGNOSIS — X500XXA Overexertion from strenuous movement or load, initial encounter: Secondary | ICD-10-CM | POA: Insufficient documentation

## 2014-04-28 DIAGNOSIS — S79929A Unspecified injury of unspecified thigh, initial encounter: Principal | ICD-10-CM

## 2014-04-28 DIAGNOSIS — M25559 Pain in unspecified hip: Secondary | ICD-10-CM

## 2014-04-28 DIAGNOSIS — Z794 Long term (current) use of insulin: Secondary | ICD-10-CM | POA: Insufficient documentation

## 2014-04-28 DIAGNOSIS — Y929 Unspecified place or not applicable: Secondary | ICD-10-CM | POA: Insufficient documentation

## 2014-04-28 DIAGNOSIS — I10 Essential (primary) hypertension: Secondary | ICD-10-CM | POA: Insufficient documentation

## 2014-04-28 DIAGNOSIS — R296 Repeated falls: Secondary | ICD-10-CM | POA: Insufficient documentation

## 2014-04-28 DIAGNOSIS — Z79899 Other long term (current) drug therapy: Secondary | ICD-10-CM | POA: Insufficient documentation

## 2014-04-28 DIAGNOSIS — Y9389 Activity, other specified: Secondary | ICD-10-CM | POA: Insufficient documentation

## 2014-04-28 DIAGNOSIS — E119 Type 2 diabetes mellitus without complications: Secondary | ICD-10-CM | POA: Insufficient documentation

## 2014-04-28 MED ORDER — NAPROXEN 500 MG PO TABS: 500.0000 mg | ORAL_TABLET | Freq: Two times a day (BID) | ORAL | Status: DC

## 2014-04-28 MED ORDER — HYDROCODONE-ACETAMINOPHEN 5-325 MG PO TABS: 2.0000 | ORAL_TABLET | ORAL | Status: DC | PRN

## 2014-04-28 NOTE — ED Notes (Signed)
Pt states yesterday he was on the side of the bed leaning over to put his shoes on and began to fall, he caught himself and felt his hip pop. States no pain on rest, but 7/10 hip pain when bearing weight on L leg.

## 2014-04-28 NOTE — Discharge Instructions (Signed)
Minimize your activity for the next few days. Slowly increase your activity  Arthralgia Arthralgia is joint pain. A joint is a place where two bones meet. Joint pain can happen for many reasons. The joint can be bruised, stiff, infected, or weak from aging. Pain usually goes away after resting and taking medicine for soreness.  HOME CARE  Rest the joint as told by your doctor.  Keep the sore joint raised (elevated) for the first 24 hours.  Put ice on the joint area.  Put ice in a plastic bag.  Place a towel between your skin and the bag.  Leave the ice on for 15-20 minutes, 03-04 times a day.  Wear your splint, casting, elastic bandage, or sling as told by your doctor.  Only take medicine as told by your doctor. Do not take aspirin.  Use crutches as told by your doctor. Do not put weight on the joint until told to by your doctor. GET HELP RIGHT AWAY IF:   You have bruising, puffiness (swelling), or more pain.  Your fingers or toes turn blue or start to lose feeling (numb).  Your medicine does not lessen the pain.  Your pain becomes severe.  You have a temperature by mouth above 102 F (38.9 C), not controlled by medicine.  You cannot move or use the joint. MAKE SURE YOU:   Understand these instructions.  Will watch your condition.  Will get help right away if you are not doing well or get worse. Document Released: 11/21/2009 Document Revised: 02/25/2012 Document Reviewed: 11/21/2009 [facility] Patient Information 2014 [place], [place].  Hip Pain The hips join the upper legs to the lower pelvis. The bones, cartilage, tendons, and muscles of the hip joint perform a lot of work each day holding your body weight and allowing you to move around. Hip pain is a common symptom. It can range from a minor ache to severe pain on 1 or both hips. Pain may be felt on the inside of the hip joint near the groin, or the outside near the buttocks and upper thigh. There may be swelling or  stiffness as well. It occurs more often when a person walks or performs activity. There are many reasons hip pain can develop. CAUSES  It is important to work with your caregiver to identify the cause since many conditions can impact the bones, cartilage, muscles, and tendons of the hips. Causes for hip pain include:  Broken (fractured) bones.  Separation of the thighbone from the hip socket (dislocation).  Torn cartilage of the hip joint.  Swelling (inflammation) of a tendon (tendonitis), the sac within the hip joint (bursitis), or a joint.  A weakening in the abdominal wall (hernia), affecting the nerves to the hip.  Arthritis in the hip joint or lining of the hip joint.  Pinched nerves in the back, hip, or upper thigh.  A bulging disc in the spine (herniated disc).  Rarely, bone infection or cancer. DIAGNOSIS  The location of your hip pain will help your caregiver understand what may be causing the pain. A diagnosis is based on your medical history, your symptoms, results from your physical exam, and results from diagnostic tests. Diagnostic tests may include X-ray exams, a computerized magnetic scan (magnetic resonance imaging, MRI), or bone scan. TREATMENT  Treatment will depend on the cause of your hip pain. Treatment may include:  Limiting activities and resting until symptoms improve.  Crutches or other walking supports (a cane or brace).  Ice, elevation, and compression.  Physical therapy or home exercises.  Shoe inserts or special shoes.  Losing weight.  Medications to reduce pain.  Undergoing surgery. HOME CARE INSTRUCTIONS   Only take over-the-counter or prescription medicines for pain, discomfort, or fever as directed by your caregiver.  Put ice on the injured area:  Put ice in a plastic bag.  Place a towel between your skin and the bag.  Leave the ice on for 15-20 minutes at a time, 03-04 times a day.  Keep your leg raised (elevated) when possible to  lessen swelling.  Avoid activities that cause pain.  Follow specific exercises as directed by your caregiver.  Sleep with a pillow between your legs on your most comfortable side.  Record how often you have hip pain, the location of the pain, and what it feels like. This information may be helpful to you and your caregiver.  Ask your caregiver about returning to work or sports and whether you should drive.  Follow up with your caregiver for further exams, therapy, or testing as directed. SEEK MEDICAL CARE IF:   Your pain or swelling continues or worsens after 1 week.  You are feeling unwell or have chills.  You have increasing difficulty with walking.  You have a loss of sensation or other new symptoms.  You have questions or concerns. SEEK IMMEDIATE MEDICAL CARE IF:   You cannot put weight on the affected hip.  You have fallen.  You have a sudden increase in pain and swelling in your hip.  You have a fever. MAKE SURE YOU:   Understand these instructions.  Will watch your condition.  Will get help right away if you are not doing well or get worse. Document Released: 05/23/2010 Document Revised: 02/25/2012 Document Reviewed: 05/23/2010 [facility] Patient Information 2014 [employer]ExitCare, [place].

## 2014-04-28 NOTE — ED Provider Notes (Signed)
CSN: [id number]     Arrival date & time 04/28/14  [id number] History   First MD Initiated Contact with Patient 04/28/14 0902     Chief Complaint  Patient presents with  . Hip Pain      HPI  Patient presents with left hip pain. Yesterday at noon he was standing. He was leaning over attempting to tie his shoes. He fell to the right. He did not hit the ground. He lost his balance and the weight shifted and twisted. He states he felt a pop and pain in his left hip. Minimal pain at rest. He is limping and describing 7/10 pain with weightbearing. No history of hip injuries or procedures were previous broken bones. No known osteoporosis.  Past Medical History  Diagnosis Date  . Diabetes mellitus without complication   . Hypertension    Past Surgical History  Procedure Laterality Date  . No past surgeries    . Cystoscopy with retrograde pyelogram, ureteroscopy and stent placement Left 04/05/2014    Procedure: CYSTOSCOPY WITH RETROGRADE PYELOGRAM, POSSIBLE LEFT URETEROSCOPY AND LEFT STENT PLACEMENT;  Surgeon: Bjorn PippinJohn Wrenn, MD;  Location: [facility];  Service: Urology;  Laterality: Left;  . Holmium laser application Left 04/05/2014    Procedure: HOLMIUM LASER APPLICATION;  Surgeon: Bjorn PippinJohn Wrenn, MD;  Location: [facility];  Service: Urology;  Laterality: Left;   History reviewed. No pertinent family history. History  Substance Use Topics  . Smoking status: Never Smoker   . Smokeless tobacco: Never Used  . Alcohol Use: No    Review of Systems  Constitutional: Negative for fever, chills, diaphoresis, appetite change and fatigue.  HENT: Negative for mouth sores, sore throat and trouble swallowing.   Eyes: Negative for visual disturbance.  Respiratory: Negative for cough, chest tightness, shortness of breath and wheezing.   Cardiovascular: Negative for chest pain.  Gastrointestinal: Negative for nausea, vomiting, abdominal pain, diarrhea and abdominal distention.  Endocrine: Negative for polydipsia, polyphagia  and polyuria.  Genitourinary: Negative for dysuria, frequency and hematuria.  Musculoskeletal: Negative for gait problem.       Pain left hip and pain with ambulation.  Skin: Negative for color change, pallor and rash.  Neurological: Negative for dizziness, syncope, light-headedness and headaches.  Hematological: Does not bruise/bleed easily.  Psychiatric/Behavioral: Negative for behavioral problems and confusion.      Allergies  Bee venom  Home Medications   Prior to Admission medications   Medication Sig Start Date End Date Taking? Authorizing Provider  amLODipine (NORVASC) 10 MG tablet Take 10 mg by mouth daily.   Yes Historical Provider, MD  esomeprazole (NEXIUM) 20 MG capsule Take 20 mg by mouth daily as needed (for heartburn).    Yes Historical Provider, MD  haloperidol (HALDOL) 5 MG tablet Take 5-10 mg by mouth 2 (two) times daily. 1 tab in am, 2 tabs in pm   Yes Historical Provider, MD  Insulin Detemir (LEVEMIR) 100 UNIT/ML Pen Inject 37 Units into the skin every morning.   Yes Historical Provider, MD  lisinopril (PRINIVIL,ZESTRIL) 10 MG tablet Take 10 mg by mouth daily.   Yes Historical Provider, MD  metoprolol (LOPRESSOR) 100 MG tablet Take 100 mg by mouth 2 (two) times daily.   Yes Historical Provider, MD  polyethylene glycol (MIRALAX / GLYCOLAX) packet Take 17 g by mouth daily as needed for mild constipation.    Yes Historical Provider, MD  pravastatin (PRAVACHOL) 40 MG tablet Take 40 mg by mouth every evening.    Yes Historical Provider, MD  BP 138/65  Pulse 72  Temp(Src) 97.8 F (36.6 C) (Oral)  Resp 16  SpO2 98% Physical Exam  Musculoskeletal:       Legs:   ED Course  Procedures (including critical care time) Labs Review Labs Reviewed - No data to display  Imaging Review Dg Hip Complete Left  04/28/2014   CLINICAL DATA:  Fall.  EXAM: LEFT HIP - COMPLETE 2+ VIEW  COMPARISON:  None.  FINDINGS: There is no evidence of hip fracture or dislocation. There is no  evidence of arthropathy or other focal bone abnormality.  IMPRESSION: Negative.   Electronically Signed   By: Signa Kellaylor  Stroud M.D.   On: 04/28/2014 11:06     EKG Interpretation None      MDM   Final diagnoses:  Hip pain, acute    His x-rays are normal. No pelvic fracture hip fracture. He has free range of motion of the leg. He is ambulatory in the room. I am not concerned about an occult fracture based on his mechanism of injury there was no impact. He may simply subluxed his hip. Plan will be minimized activity anti-inflammatory pain medicines slowly increasing activity over the next day or 2. Recheck here if any worsening symptoms.    Rolland PorterMark Britany Callicott, MD 04/28/14 1136

## 2014-05-16 ENCOUNTER — Emergency Department (HOSPITAL_COMMUNITY): Payer: Medicare Other

## 2014-05-16 ENCOUNTER — Encounter (HOSPITAL_COMMUNITY): Payer: Self-pay | Admitting: Emergency Medicine

## 2014-05-16 ENCOUNTER — Emergency Department (HOSPITAL_COMMUNITY)
Admission: EM | Admit: 2014-05-16 | Discharge: 2014-05-16 | Disposition: A | Discharge status: Home / Self Care | Payer: Medicare Other | Attending: Emergency Medicine | Admitting: Emergency Medicine

## 2014-05-16 DIAGNOSIS — Z79899 Other long term (current) drug therapy: Secondary | ICD-10-CM | POA: Insufficient documentation

## 2014-05-16 DIAGNOSIS — R109 Unspecified abdominal pain: Secondary | ICD-10-CM

## 2014-05-16 DIAGNOSIS — Z791 Long term (current) use of non-steroidal anti-inflammatories (NSAID): Secondary | ICD-10-CM | POA: Insufficient documentation

## 2014-05-16 DIAGNOSIS — N200 Calculus of kidney: Secondary | ICD-10-CM

## 2014-05-16 DIAGNOSIS — I129 Hypertensive chronic kidney disease with stage 1 through stage 4 chronic kidney disease, or unspecified chronic kidney disease: Secondary | ICD-10-CM | POA: Insufficient documentation

## 2014-05-16 DIAGNOSIS — N1330 Unspecified hydronephrosis: Secondary | ICD-10-CM

## 2014-05-16 DIAGNOSIS — K8020 Calculus of gallbladder without cholecystitis without obstruction: Secondary | ICD-10-CM

## 2014-05-16 DIAGNOSIS — E119 Type 2 diabetes mellitus without complications: Secondary | ICD-10-CM | POA: Insufficient documentation

## 2014-05-16 DIAGNOSIS — N179 Acute kidney failure, unspecified: Secondary | ICD-10-CM

## 2014-05-16 DIAGNOSIS — Z794 Long term (current) use of insulin: Secondary | ICD-10-CM | POA: Insufficient documentation

## 2014-05-16 DIAGNOSIS — N189 Chronic kidney disease, unspecified: Secondary | ICD-10-CM | POA: Insufficient documentation

## 2014-05-16 LAB — URINALYSIS, ROUTINE W REFLEX MICROSCOPIC
Bilirubin Urine: NEGATIVE
GLUCOSE, UA: NEGATIVE mg/dL
Ketones, ur: NEGATIVE mg/dL
Leukocytes, UA: NEGATIVE
Nitrite: NEGATIVE
PH: 5 (ref 5.0–8.0)
Protein, ur: >300 mg/dL — AB
Specific Gravity, Urine: 1.018 (ref 1.005–1.030)
Urobilinogen, UA: 0.2 mg/dL (ref 0.0–1.0)

## 2014-05-16 LAB — COMPREHENSIVE METABOLIC PANEL
ALBUMIN: 3.5 g/dL (ref 3.5–5.2)
ALT: 16 U/L (ref 0–53)
AST: 18 U/L (ref 0–37)
Alkaline Phosphatase: 92 U/L (ref 39–117)
BUN: 25 mg/dL — ABNORMAL HIGH (ref 6–23)
CALCIUM: 8.7 mg/dL (ref 8.4–10.5)
CO2: 21 mEq/L (ref 19–32)
Chloride: 105 mEq/L (ref 96–112)
Creatinine, Ser: 2.36 mg/dL — ABNORMAL HIGH (ref 0.50–1.35)
GFR calc Af Amer: 31 mL/min — ABNORMAL LOW (ref >90)
GFR calc non Af Amer: 27 mL/min — ABNORMAL LOW (ref >90)
Glucose, Bld: 163 mg/dL — ABNORMAL HIGH (ref 70–99)
Potassium: 5.3 mEq/L (ref 3.7–5.3)
Sodium: 137 mEq/L (ref 137–147)
TOTAL PROTEIN: 8 g/dL (ref 6.0–8.3)
Total Bilirubin: 0.5 mg/dL (ref 0.3–1.2)

## 2014-05-16 LAB — CBC WITH DIFFERENTIAL/PLATELET
BASOS ABS: 0 10*3/uL (ref 0.0–0.1)
BASOS PCT: 0 % (ref 0–1)
EOS ABS: 0.2 10*3/uL (ref 0.0–0.7)
EOS PCT: 3 % (ref 0–5)
HCT: 31.8 % — ABNORMAL LOW (ref 39.0–52.0)
Hemoglobin: 10.8 g/dL — ABNORMAL LOW (ref 13.0–17.0)
Lymphocytes Relative: 22 % (ref 12–46)
Lymphs Abs: 1.6 10*3/uL (ref 0.7–4.0)
MCH: 29 pg (ref 26.0–34.0)
MCHC: 34 g/dL (ref 30.0–36.0)
MCV: 85.5 fL (ref 78.0–100.0)
Monocytes Absolute: 0.7 10*3/uL (ref 0.1–1.0)
Monocytes Relative: 10 % (ref 3–12)
Neutro Abs: 4.9 10*3/uL (ref 1.7–7.7)
Neutrophils Relative %: 65 % (ref 43–77)
PLATELETS: 229 10*3/uL (ref 150–400)
RBC: 3.72 MIL/uL — ABNORMAL LOW (ref 4.22–5.81)
RDW: 13.3 % (ref 11.5–15.5)
WBC: 7.4 10*3/uL (ref 4.0–10.5)

## 2014-05-16 LAB — URINE MICROSCOPIC-ADD ON

## 2014-05-16 LAB — I-STAT CG4 LACTIC ACID, ED: Lactic Acid, Venous: 0.81 mmol/L (ref 0.5–2.2)

## 2014-05-16 MED ORDER — HYDROCODONE-ACETAMINOPHEN 5-325 MG PO TABS: 1.0000 | ORAL_TABLET | Freq: Four times a day (QID) | ORAL | Status: DC | PRN

## 2014-05-16 MED ORDER — IOHEXOL 300 MG/ML  SOLN
25.0000 mL | INTRAMUSCULAR | Status: AC
  Administered 2014-05-16: 25 mL via ORAL

## 2014-05-16 MED ORDER — MORPHINE SULFATE 4 MG/ML IJ SOLN
4.0000 mg | Freq: Once | INTRAMUSCULAR | Status: AC
  Administered 2014-05-16: 4 mg via INTRAVENOUS
  Filled 2014-05-16: qty 1

## 2014-05-16 MED ORDER — ONDANSETRON HCL 4 MG/2ML IJ SOLN
4.0000 mg | Freq: Once | INTRAMUSCULAR | Status: AC
  Administered 2014-05-16: 4 mg via INTRAVENOUS
  Filled 2014-05-16: qty 2

## 2014-05-16 NOTE — ED Notes (Signed)
The pt is c/o abd pain since he had a kidney stone removed April 20th

## 2014-05-16 NOTE — Discharge Instructions (Signed)
Biliary Colic  °Biliary colic is a steady or irregular pain in the upper abdomen. It is usually under the right side of the rib cage. It happens when gallstones interfere with the normal flow of bile from the gallbladder. Bile is a liquid that helps to digest fats. Bile is made in the liver and stored in the gallbladder. When you eat a meal, bile passes from the gallbladder through the cystic duct and the common bile duct into the small intestine. There, it mixes with partially digested food. If a gallstone blocks either of these ducts, the normal flow of bile is blocked. The muscle cells in the bile duct contract forcefully to try to move the stone. This causes the pain of biliary colic.  °SYMPTOMS  °· A person with biliary colic usually complains of pain in the upper abdomen. This pain can be: °· In the center of the upper abdomen just below the breastbone. °· In the upper-right part of the abdomen, near the gallbladder and liver. °· Spread back toward the right shoulder blade. °· Nausea and vomiting. °· The pain usually occurs after eating. °· Biliary colic is usually triggered by the digestive system's demand for bile. The demand for bile is high after fatty meals. Symptoms can also occur when a person who has been fasting suddenly eats a very large meal. Most episodes of biliary colic pass after 1 to 5 hours. After the most intense pain passes, your abdomen may continue to ache mildly for about 24 hours. °DIAGNOSIS  °After you describe your symptoms, your caregiver will perform a physical exam. He or she will pay attention to the upper right portion of your belly (abdomen). This is the area of your liver and gallbladder. An ultrasound will help your caregiver look for gallstones. Specialized scans of the gallbladder may also be done. Blood tests may be done, especially if you have fever or if your pain persists. °PREVENTION  °Biliary colic can be prevented by controlling the risk factors for gallstones. Some of  these risk factors, such as heredity, increasing age, and pregnancy are a normal part of life. Obesity and a high-fat diet are risk factors you can change through a healthy lifestyle. Women going through menopause who take hormone replacement therapy (estrogen) are also more likely to develop biliary colic. °TREATMENT  °· Pain medication may be prescribed. °· You may be encouraged to eat a fat-free diet. °· If the first episode of biliary colic is severe, or episodes of colic keep retuning, surgery to remove the gallbladder (cholecystectomy) is usually recommended. This procedure can be done through small incisions using an instrument called a laparoscope. The procedure often requires a brief stay in the hospital. Some people can leave the hospital the same day. It is the most widely used treatment in people troubled by painful gallstones. It is effective and safe, with no complications in more than 90% of cases. °· If surgery cannot be done, medication that dissolves gallstones may be used. This medication is expensive and can take months or years to work. Only small stones will dissolve. °· Rarely, medication to dissolve gallstones is combined with a procedure called shock-wave lithotripsy. This procedure uses carefully aimed shock waves to break up gallstones. In many people treated with this procedure, gallstones form again within a few years. °PROGNOSIS  °If gallstones block your cystic duct or common bile duct, you are at risk for repeated episodes of biliary colic. There is also a 25% chance that you will develop   a gallbladder infection(acute cholecystitis), or some other complication of gallstones within 10 to 20 years. If you have surgery, schedule it at a time that is convenient for you and at a time when you are not sick. HOME CARE INSTRUCTIONS   Drink plenty of clear fluids.  Avoid fatty, greasy or fried foods, or any foods that make your pain worse.  Take medications as directed. SEEK MEDICAL  CARE IF:   You develop a fever over 100.5 F (38.1 C).  Your pain gets worse over time.  You develop nausea that prevents you from eating and drinking.  You develop vomiting. SEEK IMMEDIATE MEDICAL CARE IF:   You have continuous or severe belly (abdominal) pain which is not relieved with medications.  You develop nausea and vomiting which is not relieved with medications.  You have symptoms of biliary colic and you suddenly develop a fever and shaking chills. This may signal cholecystitis. Call your caregiver immediately.  You develop a yellow color to your skin or the white part of your eyes (jaundice). Document Released: 05/06/2006 Document Revised: 02/25/2012 Document Reviewed: 07/15/2008 [facility] Patient Information 2014 [place], [place]. Kidney Stones Kidney stones (urolithiasis) are deposits that form inside your kidneys. The intense pain is caused by the stone moving through the urinary tract. When the stone moves, the ureter goes into spasm around the stone. The stone is usually passed in the urine.  CAUSES   A disorder that makes certain neck glands produce too much parathyroid hormone (primary hyperparathyroidism).  A buildup of uric acid crystals, similar to gout in your joints.  Narrowing (stricture) of the ureter.  A kidney obstruction present at birth (congenital obstruction).  Previous surgery on the kidney or ureters.  Numerous kidney infections. SYMPTOMS   Feeling sick to your stomach (nauseous).  Throwing up (vomiting).  Blood in the urine (hematuria).  Pain that usually spreads (radiates) to the groin.  Frequency or urgency of urination. DIAGNOSIS   Taking a history and physical exam.  Blood or urine tests.  CT scan.  Occasionally, an examination of the inside of the urinary bladder (cystoscopy) is performed. TREATMENT   Observation.  Increasing your fluid intake.  Extracorporeal shock wave lithotripsy This is a noninvasive procedure  that uses shock waves to break up kidney stones.  Surgery may be needed if you have severe pain or persistent obstruction. There are various surgical procedures. Most of the procedures are performed with the use of small instruments. Only small incisions are needed to accommodate these instruments, so recovery time is minimized. The size, location, and chemical composition are all important variables that will determine the proper choice of action for you. Talk to your health care provider to better understand your situation so that you will minimize the risk of injury to yourself and your kidney.  HOME CARE INSTRUCTIONS   Drink enough water and fluids to keep your urine clear or pale yellow. This will help you to pass the stone or stone fragments.  Strain all urine through the provided strainer. Keep all particulate matter and stones for your health care provider to see. The stone causing the pain may be as small as a grain of salt. It is very important to use the strainer each and every time you pass your urine. The collection of your stone will allow your health care provider to analyze it and verify that a stone has actually passed. The stone analysis will often identify what you can do to reduce the incidence of recurrences.  Only take over-the-counter or prescription medicines for pain, discomfort, or fever as directed by your health care provider.  Make a follow-up appointment with your health care provider as directed.  Get follow-up X-rays if required. The absence of pain does not always mean that the stone has passed. It may have only stopped moving. If the urine remains completely obstructed, it can cause loss of kidney function or even complete destruction of the kidney. It is your responsibility to make sure X-rays and follow-ups are completed. Ultrasounds of the kidney can show blockages and the status of the kidney. Ultrasounds are not associated with any radiation and can be performed  easily in a matter of minutes. SEEK MEDICAL CARE IF:  You experience pain that is progressive and unresponsive to any pain medicine you have been prescribed. SEEK IMMEDIATE MEDICAL CARE IF:   Pain cannot be controlled with the prescribed medicine.  You have a fever or shaking chills.  The severity or intensity of pain increases over 18 hours and is not relieved by pain medicine.  You develop a new onset of abdominal pain.  You feel faint or pass out.  You are unable to urinate. MAKE SURE YOU:   Understand these instructions.  Will watch your condition.  Will get help right away if you are not doing well or get worse. Document Released: 12/03/2005 Document Revised: 08/05/2013 Document Reviewed: 05/06/2013 [facility] Patient Information 2014 [place].

## 2014-05-16 NOTE — ED Notes (Signed)
Pt c/o abd pain that started after having a stone removed. Pt states he went to his pcp for problem and is waiting on a referral to GI doctor. Pt rates pain 8/10. Pt reports pain gets worse after eating and with movement. Pt describes pain as dull and nagging.

## 2014-05-16 NOTE — ED Provider Notes (Signed)
CSN: [id number]     Arrival date & time 05/16/14  0621 History   First MD Initiated Contact with Patient 05/16/14 0654     Chief Complaint  Patient presents with  . Abdominal Pain     (Consider location/radiation/quality/duration/timing/severity/associated sxs/prior Treatment) HPI  This is a 68 year old male with history of diabetes and hypertension and recent history of nephrolithiasis who presents with mid right abdominal pain. Patient states he's been having pain since April 20 when he had a "kidney stone removed." He has since followed up with urology and his primary physician and is being referred to GI specialist. Patient describes his pain as burning. He states that it is worse with eating. Currently his pain is 7/10. He has not taken any medications at home. He reports associated nausea. He denies any vomiting, diarrhea, or dysuria. He denies any fevers.  Past Medical History  Diagnosis Date  . Diabetes mellitus without complication   . Hypertension    Past Surgical History  Procedure Laterality Date  . No past surgeries    . Cystoscopy with retrograde pyelogram, ureteroscopy and stent placement Left 04/05/2014    Procedure: CYSTOSCOPY WITH RETROGRADE PYELOGRAM, POSSIBLE LEFT URETEROSCOPY AND LEFT STENT PLACEMENT;  Surgeon: Bjorn Pippin, MD;  Location: [facility];  Service: Urology;  Laterality: Left;  . Holmium laser application Left 04/05/2014    Procedure: HOLMIUM LASER APPLICATION;  Surgeon: Bjorn Pippin, MD;  Location: [facility];  Service: Urology;  Laterality: Left;   No family history on file. History  Substance Use Topics  . Smoking status: Never Smoker   . Smokeless tobacco: Never Used  . Alcohol Use: No    Review of Systems  Constitutional: Negative.  Negative for fever.  Respiratory: Negative.  Negative for chest tightness and shortness of breath.   Cardiovascular: Negative.  Negative for chest pain.  Gastrointestinal: Positive for nausea and abdominal pain. Negative for  vomiting, diarrhea and constipation.  Genitourinary: Negative.  Negative for dysuria and hematuria.  Musculoskeletal: Negative for back pain.  Skin: Negative for rash.  Neurological: Negative for headaches.  All other systems reviewed and are negative.     Allergies  Bee venom  Home Medications   Prior to Admission medications   Medication Sig Start Date End Date Taking? Authorizing Provider  amLODipine (NORVASC) 10 MG tablet Take 10 mg by mouth daily.   Yes Historical Provider, MD  esomeprazole (NEXIUM) 20 MG capsule Take 20 mg by mouth daily as needed (for heartburn).    Yes Historical Provider, MD  haloperidol (HALDOL) 5 MG tablet Take 5-10 mg by mouth 2 (two) times daily. 1 tab in am, 2 tabs in pm   Yes Historical Provider, MD  HYDROcodone-acetaminophen (NORCO/VICODIN) 5-325 MG per tablet Take 2 tablets by mouth every 4 (four) hours as needed. 04/28/14  Yes Rolland Porter, MD  Insulin Detemir (LEVEMIR) 100 UNIT/ML Pen Inject 37 Units into the skin every morning.   Yes Historical Provider, MD  lisinopril (PRINIVIL,ZESTRIL) 10 MG tablet Take 10 mg by mouth daily.   Yes Historical Provider, MD  metoprolol (LOPRESSOR) 100 MG tablet Take 100 mg by mouth 2 (two) times daily.   Yes Historical Provider, MD  naproxen (NAPROSYN) 500 MG tablet Take 1 tablet (500 mg total) by mouth 2 (two) times daily. 04/28/14  Yes Rolland Porter, MD  polyethylene glycol [facility] / GLYCOLAX) packet Take 17 g by mouth daily as needed for mild constipation.    Yes Historical Provider, MD  pravastatin (PRAVACHOL) 40 MG  tablet Take 40 mg by mouth every evening.    Yes Historical Provider, MD  HYDROcodone-acetaminophen (NORCO/VICODIN) 5-325 MG per tablet Take 1 tablet by mouth every 6 (six) hours as needed for moderate pain or severe pain. 05/16/14   [name], MD   BP 143/71  Pulse 79  Temp(Src) 97.9 F (36.6 C) (Oral)  Resp 16  Ht 5' 10.5" (1.791 m)  Wt 190 lb (86.183 kg)  BMI 26.87 kg/m2  SpO2 97% Physical  Exam  Nursing note and vitals reviewed. Constitutional: He is oriented to person, place, and time. No distress.   elderly  HENT:  Head: Normocephalic and atraumatic.  Mouth/Throat: Oropharynx is clear and moist.  Neck: Neck supple.  Cardiovascular: Normal rate, regular rhythm and normal heart sounds.   No murmur heard. Pulmonary/Chest: Effort normal and breath sounds normal. No respiratory distress.  Abdominal: Soft. Bowel sounds are normal. He exhibits no distension and no mass. There is no tenderness. There is no rebound and no guarding.  Musculoskeletal: He exhibits no edema.  Neurological: He is alert and oriented to person, place, and time.  Skin: Skin is warm and dry.  Psychiatric: He has a normal mood and affect.    ED Course  Procedures (including critical care time) Labs Review Labs Reviewed  CBC WITH DIFFERENTIAL - Abnormal; Notable for the following:    RBC 3.72 (*)    Hemoglobin 10.8 (*)    HCT 31.8 (*)    All other components within normal limits  COMPREHENSIVE METABOLIC PANEL - Abnormal; Notable for the following:    Glucose, Bld 163 (*)    BUN 25 (*)    Creatinine, Ser 2.36 (*)    GFR calc non Af Amer 27 (*)    GFR calc Af Amer 31 (*)    All other components within normal limits  URINALYSIS, ROUTINE W REFLEX MICROSCOPIC - Abnormal; Notable for the following:    APPearance CLOUDY (*)    Hgb urine dipstick TRACE (*)    Protein, ur >300 (*)    All other components within normal limits  URINE MICROSCOPIC-ADD ON - Abnormal; Notable for the following:    Bacteria, UA FEW (*)    All other components within normal limits  I-STAT CG4 LACTIC ACID, ED    Imaging Review Ct Abdomen Pelvis Wo Contrast  05/16/2014   CLINICAL DATA:  Right-sided abdominal pain  EXAM: CT ABDOMEN AND PELVIS WITHOUT CONTRAST  TECHNIQUE: Multidetector CT imaging of the abdomen and pelvis was performed following the standard protocol without IV contrast.  COMPARISON:  04/01/14  FINDINGS: Lung  bases show some chronic changes bilaterally which are stable from the prior exam.  The liver, spleen, adrenal glands and pancreas are within normal limits. The gallbladder is well distended and demonstrates multiple gallstones. The kidneys are well visualized bilaterally. The right kidney is within normal limits. The left kidney demonstrates significant hydronephrosis and hydroureter which extends to the level of the mid to distal left ureter. Two stones are noted in the mid to distal ureter best seen on image number 68 of series 2. These are stable from the prior exam the largest of these measures approximately 5 mm. The more distal left ureter is within normal limits. The bladder is well distended. No bladder calculi are seen. No pelvic mass lesion or sidewall abnormality is noted. The appendix is within normal limits. Very mild diverticular change is noted without diverticulitis.  IMPRESSION: Worsening left-sided hydronephrosis and hydroureter secondary to 2 obstructing  stones at the level of mid to distal left ureter.  Cholelithiasis without complicating factors.  .  No other focal acute abnormality is noted.   Electronically Signed   By: Alcide CleverMark  Lukens M.D.   On: 05/16/2014 11:24   Dg Abd 1 View  05/16/2014   CLINICAL DATA:  Right abdominal pain, recent left kidney stone removal  EXAM: ABDOMEN - 1 VIEW  COMPARISON:  04/05/2014  FINDINGS: Nonobstructive bowel gas pattern.  Mild to moderate colonic stool burden, likely within normal limits.  No radiopaque calculi are seen.  Mild degenerative changes of the lower thoracic spine.  IMPRESSION: Unremarkable abdominal radiograph.   Electronically Signed   By: Charline BillsSriyesh  Krishnan M.D.   On: 05/16/2014 10:27     EKG Interpretation None      MDM   Final diagnoses:  Abdominal pain  Gallstones  Kidney stone  Hydronephrosis  Acute-on-chronic kidney injury    Patient is 68 year old with recent history of lithotripsy who presents with right mid abdominal  pain. He is nontoxic on exam. He has no signs of peritonitis. Patient is being referred as an outpatient to gastroenterology.  No signs of peritonitis. He is comfortable appearing. Vital signs are reassuring he is afebrile. Basic lab work including LFTs obtained.  Lab work remarkable for a creatinine of 2.36 with a baseline of 1.56; however, at time of lithotripsy one month ago creatinine was elevated to 2.51. Will obtain CT scan for further evaluation.  CT scan shows persistent, worsening left hydronephrosis with obstructing kidney stone.  Patient's symptoms are mostly right-sided which does not necessarily explain his symptoms. However, patient also noted to have cholelithiasis without evidence of cholecystitis on CT scan. He is nontender and his right upper quadrant and midabdominal pain may be related to biliary colic.  I discussed all findings with the patient and his family. Given that his creatinine is stable from one month ago and he is afebrile and without evidence of UTI, feel urgent followup with urology as warranted. I have reviewed his operative notes and he is since had his ureteral stent removed. However, it appears he has a persistent stone in the left ureter. Have also given him general surgery followup concerns for biliary colic. No evidence of acute cholecystitis or need for emergent surgical evaluation. Patient was able to tolerate by mouth prior to discharge.  After history, exam, and medical workup I feel the patient has been appropriately medically screened and is safe for discharge home. Pertinent diagnoses were discussed with the patient. Patient was given return precautions.    Shon Batonourtney F Horton, MD 05/16/14 1239

## 2014-06-09 ENCOUNTER — Ambulatory Visit (INDEPENDENT_AMBULATORY_CARE_PROVIDER_SITE_OTHER): Payer: Medicare Other | Admitting: General Surgery

## 2014-06-09 ENCOUNTER — Telehealth (INDEPENDENT_AMBULATORY_CARE_PROVIDER_SITE_OTHER): Payer: Self-pay

## 2014-06-09 ENCOUNTER — Encounter (INDEPENDENT_AMBULATORY_CARE_PROVIDER_SITE_OTHER): Payer: Self-pay | Admitting: General Surgery

## 2014-06-09 ENCOUNTER — Telehealth (INDEPENDENT_AMBULATORY_CARE_PROVIDER_SITE_OTHER): Payer: Self-pay | Admitting: General Surgery

## 2014-06-09 VITALS — BP 162/84 | HR 72 | Temp 97.8°F | Resp 16 | Ht 70.5 in | Wt 189.2 lb

## 2014-06-09 DIAGNOSIS — K8020 Calculus of gallbladder without cholecystitis without obstruction: Secondary | ICD-10-CM

## 2014-06-09 NOTE — Progress Notes (Signed)
Patient ID: [name] [name], male   DOB: [birth year], 68 y.o.   MRN: [id number]  Chief Complaint  Patient presents with  . Cholelithiasis    new pt    HPI [name] LairFrancis B Wulff is a 68 y.o. male.  Chief complaint: Right upper quadrant pain and gallstones HPI Dr. Dulce Sellarutlaw asked me to see Mr. Mcgilvray regarding possible cholecystectomy. He has been having episodic right upper quadrant pain associated with nausea but no vomiting. He visited the emergency room and underwent CT scan which demonstrated gallstones without complicating features. Dr. Dulce Sellarutlaw from gastroenterology evaluated him. He is scheduled for colonoscopy tomorrow. Additionally, obstructing left ureteral stones were found on his CAT scan. He is following up with Dr. Wilson SingerWren this Friday regarding that. Past Medical History  Diagnosis Date  . Diabetes mellitus without complication   . Hypertension   . GERD (gastroesophageal reflux disease)   . Hyperlipidemia   . Chronic kidney disease     kidney stones    Past Surgical History  Procedure Laterality Date  . No past surgeries    . Cystoscopy with retrograde pyelogram, ureteroscopy and stent placement Left 04/05/2014    Procedure: CYSTOSCOPY WITH RETROGRADE PYELOGRAM, POSSIBLE LEFT URETEROSCOPY AND LEFT STENT PLACEMENT;  Surgeon: Bjorn PippinJohn Wrenn, MD;  Location: [facility];  Service: Urology;  Laterality: Left;  . Holmium laser application Left 04/05/2014    Procedure: HOLMIUM LASER APPLICATION;  Surgeon: Bjorn PippinJohn Wrenn, MD;  Location: [facility];  Service: Urology;  Laterality: Left;  . Lithotripsy    . Kidney surgery      Family History  Problem Relation Age of Onset  . Stroke Mother   . Heart disease Father     Social History History  Substance Use Topics  . Smoking status: Never Smoker   . Smokeless tobacco: Never Used  . Alcohol Use: No    Allergies  Allergen Reactions  . Bee Venom Anaphylaxis    Current Outpatient Prescriptions  Medication Sig Dispense Refill  . amLODipine (NORVASC) 10  MG tablet Take 10 mg by mouth daily.      Marland Kitchen. esomeprazole (NEXIUM) 20 MG capsule Take 20 mg by mouth daily as needed (for heartburn).       . haloperidol (HALDOL) 5 MG tablet Take 5-10 mg by mouth 2 (two) times daily. 1 tab in am, 2 tabs in pm      . Insulin Detemir (LEVEMIR) 100 UNIT/ML Pen Inject 37 Units into the skin every morning.      Marland Kitchen. lisinopril (PRINIVIL,ZESTRIL) 10 MG tablet Take 10 mg by mouth daily.      . pravastatin (PRAVACHOL) 40 MG tablet Take 40 mg by mouth every evening.        No current facility-administered medications for this visit.    Review of Systems Review of Systems  Constitutional: Negative for fever, chills and unexpected weight change.  HENT: Negative for congestion, hearing loss, sore throat, trouble swallowing and voice change.   Eyes: Negative for visual disturbance.  Respiratory: Negative for cough and wheezing.   Cardiovascular: Negative for chest pain, palpitations and leg swelling.  Gastrointestinal: Positive for nausea and abdominal pain. Negative for vomiting, diarrhea, constipation, blood in stool, abdominal distention, anal bleeding and rectal pain.  Genitourinary: Negative for hematuria and difficulty urinating.       See history of present illness  Musculoskeletal: Negative for arthralgias.  Skin: Negative for rash and wound.  Neurological: Negative for seizures, syncope, weakness and headaches.  Hematological: Negative for adenopathy. Does not  bruise/bleed easily.  Psychiatric/Behavioral: Negative for confusion.       History of psychosis    Blood pressure 162/84, pulse 72, temperature 97.8 F (36.6 C), temperature source Temporal, resp. rate 16, height 5' 10.5" (1.791 m), weight 189 lb 3.2 oz (85.821 kg).  Physical Exam Physical Exam  Constitutional: He is oriented to person, place, and time. He appears well-developed and well-nourished. No distress.  HENT:  Head: Normocephalic and atraumatic.  Nose: Nose normal.  Mouth/Throat:  Oropharynx is clear and moist. No oropharyngeal exudate.  Eyes: EOM are normal. Pupils are equal, round, and reactive to light.  Neck: Neck supple. No tracheal deviation present.  Cardiovascular: Normal rate, normal heart sounds and intact distal pulses.   Pulmonary/Chest: Effort normal and breath sounds normal. No stridor. No respiratory distress. He has no wheezes. He has no rales.  Abdominal: Soft. He exhibits no distension. There is no tenderness. There is no rebound and no guarding.  Musculoskeletal: Normal range of motion.  Neurological: He is alert and oriented to person, place, and time. He exhibits normal muscle tone.  Skin: Skin is warm.  Psychiatric: He has a normal mood and affect.    Data Reviewed Office notes fromf Dr. Dulce Sellarutlaw, CT report  Assessment    Symptomatic cholelithiasis, GI workup ongoing, obstructing left ureteral stones    Plan    If colonoscopy goes well tomorrow and no further urology treatments are needed, we'll plan laparoscopic cholecystectomy with cholangiogram in the near future. Procedure, risks, benefits were discussed in detail with him and his daughter. I answered their questions and gave him educational material. He will give us a call after his appointments with GI and urology.       THOMPSON,BURKE E 06/09/2014, 10:01 AM

## 2014-06-09 NOTE — Patient Instructions (Signed)
Call [phone] and ask for scheduling When ready to set up surgery for gallbladder removal

## 2014-06-09 NOTE — Telephone Encounter (Signed)
Patient will call back to schedule per face sheet.

## 2014-06-09 NOTE — Telephone Encounter (Signed)
Surgery scheduling form complete and to surgery schedulers. Pt will call back once he decides when he wants to proceed.

## 2015-04-11 ENCOUNTER — Other Ambulatory Visit: Payer: Self-pay | Admitting: Nephrology

## 2015-04-14 ENCOUNTER — Ambulatory Visit
Admission: RE | Admit: 2015-04-14 | Discharge: 2015-04-14 | Disposition: A | Discharge status: Home / Self Care | Payer: Medicare Other | Source: Ambulatory Visit | Attending: Nurse Practitioner | Admitting: Nurse Practitioner

## 2015-04-14 ENCOUNTER — Other Ambulatory Visit: Payer: Self-pay | Admitting: Nurse Practitioner

## 2015-04-14 DIAGNOSIS — J8410 Pulmonary fibrosis, unspecified: Secondary | ICD-10-CM

## 2015-04-21 ENCOUNTER — Other Ambulatory Visit: Payer: Self-pay | Admitting: Nephrology

## 2015-04-21 DIAGNOSIS — E1322 Other specified diabetes mellitus with diabetic chronic kidney disease: Secondary | ICD-10-CM

## 2015-04-21 DIAGNOSIS — N183 Chronic kidney disease, stage 3 unspecified: Secondary | ICD-10-CM

## 2015-04-21 DIAGNOSIS — F2089 Other schizophrenia: Secondary | ICD-10-CM

## 2015-04-21 DIAGNOSIS — I129 Hypertensive chronic kidney disease with stage 1 through stage 4 chronic kidney disease, or unspecified chronic kidney disease: Secondary | ICD-10-CM

## 2015-05-03 ENCOUNTER — Other Ambulatory Visit: Payer: Medicare Other

## 2015-06-07 ENCOUNTER — Emergency Department (HOSPITAL_COMMUNITY): Payer: Medicare Other

## 2015-06-07 ENCOUNTER — Encounter (HOSPITAL_COMMUNITY): Payer: Self-pay | Admitting: Emergency Medicine

## 2015-06-07 ENCOUNTER — Inpatient Hospital Stay (HOSPITAL_COMMUNITY)
Admission: EM | Admit: 2015-06-07 | Discharge: 2015-06-11 | DRG: 682 | Disposition: A | Discharge status: Skilled Nursing Facility | Payer: Medicare Other | Attending: Internal Medicine | Admitting: Internal Medicine

## 2015-06-07 DIAGNOSIS — R471 Dysarthria and anarthria: Secondary | ICD-10-CM

## 2015-06-07 DIAGNOSIS — I129 Hypertensive chronic kidney disease with stage 1 through stage 4 chronic kidney disease, or unspecified chronic kidney disease: Secondary | ICD-10-CM | POA: Diagnosis present

## 2015-06-07 DIAGNOSIS — F209 Schizophrenia, unspecified: Secondary | ICD-10-CM | POA: Diagnosis present

## 2015-06-07 DIAGNOSIS — K219 Gastro-esophageal reflux disease without esophagitis: Secondary | ICD-10-CM | POA: Diagnosis present

## 2015-06-07 DIAGNOSIS — N179 Acute kidney failure, unspecified: Principal | ICD-10-CM | POA: Diagnosis present

## 2015-06-07 DIAGNOSIS — E872 Acidosis: Secondary | ICD-10-CM | POA: Diagnosis present

## 2015-06-07 DIAGNOSIS — D631 Anemia in chronic kidney disease: Secondary | ICD-10-CM | POA: Diagnosis present

## 2015-06-07 DIAGNOSIS — R269 Unspecified abnormalities of gait and mobility: Secondary | ICD-10-CM

## 2015-06-07 DIAGNOSIS — R509 Fever, unspecified: Secondary | ICD-10-CM

## 2015-06-07 DIAGNOSIS — Z66 Do not resuscitate: Secondary | ICD-10-CM | POA: Diagnosis present

## 2015-06-07 DIAGNOSIS — E11649 Type 2 diabetes mellitus with hypoglycemia without coma: Secondary | ICD-10-CM | POA: Diagnosis present

## 2015-06-07 DIAGNOSIS — Z794 Long term (current) use of insulin: Secondary | ICD-10-CM

## 2015-06-07 DIAGNOSIS — E875 Hyperkalemia: Secondary | ICD-10-CM | POA: Diagnosis present

## 2015-06-07 DIAGNOSIS — N184 Chronic kidney disease, stage 4 (severe): Secondary | ICD-10-CM | POA: Diagnosis present

## 2015-06-07 DIAGNOSIS — E1122 Type 2 diabetes mellitus with diabetic chronic kidney disease: Secondary | ICD-10-CM | POA: Diagnosis present

## 2015-06-07 DIAGNOSIS — E785 Hyperlipidemia, unspecified: Secondary | ICD-10-CM | POA: Diagnosis present

## 2015-06-07 DIAGNOSIS — E162 Hypoglycemia, unspecified: Secondary | ICD-10-CM

## 2015-06-07 DIAGNOSIS — G9340 Encephalopathy, unspecified: Secondary | ICD-10-CM | POA: Diagnosis not present

## 2015-06-07 DIAGNOSIS — Z7982 Long term (current) use of aspirin: Secondary | ICD-10-CM

## 2015-06-07 DIAGNOSIS — I10 Essential (primary) hypertension: Secondary | ICD-10-CM

## 2015-06-07 LAB — BASIC METABOLIC PANEL
Anion gap: 11 (ref 5–15)
Anion gap: 10 (ref 5–15)
BUN: 48 mg/dL — ABNORMAL HIGH (ref 6–20)
BUN: 43 mg/dL — ABNORMAL HIGH (ref 6–20)
CALCIUM: 8.5 mg/dL — AB (ref 8.9–10.3)
CHLORIDE: 109 mmol/L (ref 101–111)
CO2: 18 mmol/L — AB (ref 22–32)
CO2: 20 mmol/L — ABNORMAL LOW (ref 22–32)
Calcium: 8.6 mg/dL — ABNORMAL LOW (ref 8.9–10.3)
Chloride: 112 mmol/L — ABNORMAL HIGH (ref 101–111)
Creatinine, Ser: 4.74 mg/dL — ABNORMAL HIGH (ref 0.61–1.24)
Creatinine, Ser: 4.42 mg/dL — ABNORMAL HIGH (ref 0.61–1.24)
GFR calc Af Amer: 13 mL/min — ABNORMAL LOW (ref >60)
GFR calc Af Amer: 14 mL/min — ABNORMAL LOW (ref >60)
GFR calc non Af Amer: 11 mL/min — ABNORMAL LOW (ref >60)
GFR calc non Af Amer: 12 mL/min — ABNORMAL LOW (ref >60)
Glucose, Bld: 124 mg/dL — ABNORMAL HIGH (ref 65–99)
Glucose, Bld: 133 mg/dL — ABNORMAL HIGH (ref 65–99)
Potassium: 6.3 mmol/L (ref 3.5–5.1)
Potassium: 4.4 mmol/L (ref 3.5–5.1)
SODIUM: 138 mmol/L (ref 135–145)
SODIUM: 142 mmol/L (ref 135–145)

## 2015-06-07 LAB — CBC
HCT: 32.8 % — ABNORMAL LOW (ref 39.0–52.0)
HCT: 32.3 % — ABNORMAL LOW (ref 39.0–52.0)
Hemoglobin: 10.8 g/dL — ABNORMAL LOW (ref 13.0–17.0)
Hemoglobin: 10.7 g/dL — ABNORMAL LOW (ref 13.0–17.0)
MCH: 28.1 pg (ref 26.0–34.0)
MCH: 28.4 pg (ref 26.0–34.0)
MCHC: 32.9 g/dL (ref 30.0–36.0)
MCHC: 33.1 g/dL (ref 30.0–36.0)
MCV: 85.2 fL (ref 78.0–100.0)
MCV: 85.7 fL (ref 78.0–100.0)
PLATELETS: 263 10*3/uL (ref 150–400)
Platelets: 255 10*3/uL (ref 150–400)
RBC: 3.85 MIL/uL — ABNORMAL LOW (ref 4.22–5.81)
RBC: 3.77 MIL/uL — AB (ref 4.22–5.81)
RDW: 13.4 % (ref 11.5–15.5)
RDW: 13.3 % (ref 11.5–15.5)
WBC: 7.4 10*3/uL (ref 4.0–10.5)
WBC: 7.5 10*3/uL (ref 4.0–10.5)

## 2015-06-07 LAB — GLUCOSE, CAPILLARY
Glucose-Capillary: 119 mg/dL — ABNORMAL HIGH (ref 65–99)
Glucose-Capillary: 131 mg/dL — ABNORMAL HIGH (ref 65–99)
Glucose-Capillary: 117 mg/dL — ABNORMAL HIGH (ref 65–99)
Glucose-Capillary: 113 mg/dL — ABNORMAL HIGH (ref 65–99)

## 2015-06-07 LAB — URINALYSIS, ROUTINE W REFLEX MICROSCOPIC
Bilirubin Urine: NEGATIVE
Glucose, UA: NEGATIVE mg/dL
Ketones, ur: NEGATIVE mg/dL
Leukocytes, UA: NEGATIVE
Nitrite: NEGATIVE
PROTEIN: 100 mg/dL — AB
Specific Gravity, Urine: 1.011 (ref 1.005–1.030)
UROBILINOGEN UA: 0.2 mg/dL (ref 0.0–1.0)
pH: 5 (ref 5.0–8.0)

## 2015-06-07 LAB — CBG MONITORING, ED
GLUCOSE-CAPILLARY: 121 mg/dL — AB (ref 65–99)
Glucose-Capillary: 118 mg/dL — ABNORMAL HIGH (ref 65–99)
Glucose-Capillary: 99 mg/dL (ref 65–99)
Glucose-Capillary: 100 mg/dL — ABNORMAL HIGH (ref 65–99)

## 2015-06-07 LAB — URINE MICROSCOPIC-ADD ON

## 2015-06-07 LAB — CREATININE, SERUM
CREATININE: 4.75 mg/dL — AB (ref 0.61–1.24)
GFR, EST AFRICAN AMERICAN: 13 mL/min — AB (ref >60)
GFR, EST NON AFRICAN AMERICAN: 11 mL/min — AB (ref >60)

## 2015-06-07 LAB — I-STAT TROPONIN, ED: Troponin i, poc: 0.04 ng/mL (ref 0.00–0.08)

## 2015-06-07 LAB — SODIUM, URINE, RANDOM: Sodium, Ur: 85 mmol/L

## 2015-06-07 LAB — CREATININE, URINE, RANDOM: CREATININE, URINE: 77.81 mg/dL

## 2015-06-07 LAB — I-STAT CG4 LACTIC ACID, ED: Lactic Acid, Venous: 0.81 mmol/L (ref 0.5–2.0)

## 2015-06-07 MED ORDER — AMLODIPINE BESYLATE 10 MG PO TABS
10.0000 mg | ORAL_TABLET | Freq: Every day | ORAL | Status: DC
  Administered 2015-06-08: 10 mg via ORAL
  Filled 2015-06-07 (×2): qty 1

## 2015-06-07 MED ORDER — SODIUM POLYSTYRENE SULFONATE 15 GM/60ML PO SUSP
15.0000 g | Freq: Once | ORAL | Status: AC
  Administered 2015-06-07: 15 g via ORAL
  Filled 2015-06-07: qty 60

## 2015-06-07 MED ORDER — ALUM & MAG HYDROXIDE-SIMETH 200-200-20 MG/5ML PO SUSP: 30.0000 mL | Freq: Four times a day (QID) | ORAL | Status: DC | PRN

## 2015-06-07 MED ORDER — HALOPERIDOL 5 MG PO TABS
5.0000 mg | ORAL_TABLET | Freq: Every day | ORAL | Status: DC
  Administered 2015-06-08 – 2015-06-09 (×2): 5 mg via ORAL
  Filled 2015-06-07 (×2): qty 1

## 2015-06-07 MED ORDER — PRAVASTATIN SODIUM 40 MG PO TABS
40.0000 mg | ORAL_TABLET | Freq: Every evening | ORAL | Status: DC
  Administered 2015-06-07 – 2015-06-10 (×4): 40 mg via ORAL
  Filled 2015-06-07 (×5): qty 1

## 2015-06-07 MED ORDER — ACETAMINOPHEN 650 MG RE SUPP: 650.0000 mg | Freq: Four times a day (QID) | RECTAL | Status: DC | PRN

## 2015-06-07 MED ORDER — AMLODIPINE BESYLATE 10 MG PO TABS: 10.0000 mg | ORAL_TABLET | Freq: Every day | ORAL | Status: DC

## 2015-06-07 MED ORDER — HEPARIN SODIUM (PORCINE) 5000 UNIT/ML IJ SOLN
5000.0000 [IU] | Freq: Three times a day (TID) | INTRAMUSCULAR | Status: DC
  Administered 2015-06-07 – 2015-06-11 (×12): 5000 [IU] via SUBCUTANEOUS
  Filled 2015-06-07 (×13): qty 1

## 2015-06-07 MED ORDER — SODIUM CHLORIDE 0.9 % IV BOLUS (SEPSIS)
500.0000 mL | Freq: Once | INTRAVENOUS | Status: AC
  Administered 2015-06-07: 500 mL via INTRAVENOUS

## 2015-06-07 MED ORDER — ACETAMINOPHEN 325 MG PO TABS: 650.0000 mg | ORAL_TABLET | Freq: Four times a day (QID) | ORAL | Status: DC | PRN

## 2015-06-07 MED ORDER — SODIUM CHLORIDE 0.9 % IV SOLN
INTRAVENOUS | Status: DC
Start: 2015-06-07 — End: 2015-06-09
  Administered 2015-06-07: 22:00:00 via INTRAVENOUS
  Administered 2015-06-08: 125 mL/h via INTRAVENOUS
  Administered 2015-06-08 (×2): via INTRAVENOUS
  Administered 2015-06-09: 125 mL/h via INTRAVENOUS

## 2015-06-07 MED ORDER — ASPIRIN 81 MG PO CHEW
81.0000 mg | CHEWABLE_TABLET | Freq: Every day | ORAL | Status: DC
  Administered 2015-06-08 – 2015-06-11 (×4): 81 mg via ORAL
  Filled 2015-06-07 (×6): qty 1

## 2015-06-07 MED ORDER — BENZTROPINE MESYLATE 1 MG PO TABS
1.0000 mg | ORAL_TABLET | Freq: Two times a day (BID) | ORAL | Status: DC
Start: 2015-06-07 — End: 2015-06-11
  Administered 2015-06-07 – 2015-06-11 (×8): 1 mg via ORAL
  Filled 2015-06-07 (×9): qty 1

## 2015-06-07 MED ORDER — METOPROLOL TARTRATE 100 MG PO TABS
100.0000 mg | ORAL_TABLET | Freq: Two times a day (BID) | ORAL | Status: DC
  Administered 2015-06-07 – 2015-06-08 (×3): 100 mg via ORAL
  Filled 2015-06-07 (×5): qty 1

## 2015-06-07 MED ORDER — HEPARIN SODIUM (PORCINE) 5000 UNIT/ML IJ SOLN: 5000.0000 [IU] | Freq: Three times a day (TID) | INTRAMUSCULAR | Status: DC

## 2015-06-07 MED ORDER — HALOPERIDOL 5 MG PO TABS
10.0000 mg | ORAL_TABLET | Freq: Every day | ORAL | Status: DC
  Administered 2015-06-07 – 2015-06-08 (×2): 10 mg via ORAL
  Filled 2015-06-07 (×3): qty 2

## 2015-06-07 MED ORDER — ONDANSETRON HCL 4 MG PO TABS: 4.0000 mg | ORAL_TABLET | Freq: Four times a day (QID) | ORAL | Status: DC | PRN

## 2015-06-07 MED ORDER — ONDANSETRON HCL 4 MG/2ML IJ SOLN
4.0000 mg | Freq: Four times a day (QID) | INTRAMUSCULAR | Status: DC | PRN
  Administered 2015-06-08: 4 mg via INTRAVENOUS
  Filled 2015-06-07: qty 2

## 2015-06-07 MED ORDER — SODIUM CHLORIDE 0.9 % IV BOLUS (SEPSIS)
1000.0000 mL | Freq: Once | INTRAVENOUS | Status: AC
  Administered 2015-06-07: 1000 mL via INTRAVENOUS

## 2015-06-07 MED ORDER — HALOPERIDOL 5 MG PO TABS: 5.0000 mg | ORAL_TABLET | ORAL | Status: DC

## 2015-06-07 NOTE — ED Notes (Signed)
Admitting at bedside 

## 2015-06-07 NOTE — ED Notes (Signed)
Spoke to Patient Placement about bed assignment

## 2015-06-07 NOTE — ED Notes (Addendum)
Dr. Gwendolyn Grant made aware of Critical Value for pt - Potassium 6.3

## 2015-06-07 NOTE — ED Notes (Signed)
Per [facility] pts family called EMS to come evaluate pt b/c he was having slurred speech, L sided weakness, and his lips are drawn in.  Pt last seen normal at 12:00 noon yesterday.  Pts CBG was 37, he was given a 1/2 amp of D5 in route, CBG increased to 198.  Pt complained of having difficulty opening his right eye and was incontinent of urine.  VS are as follows: BP:154/80 HR:92

## 2015-06-07 NOTE — ED Notes (Signed)
CBG= 121

## 2015-06-07 NOTE — ED Notes (Signed)
Pt attempting to provide urine sample

## 2015-06-07 NOTE — ED Notes (Signed)
CBG at current 118. Received 12.5 d50 in route by EMS.

## 2015-06-07 NOTE — ED Notes (Signed)
Pt's condom catheter leaked and pt wet in bed.  Pt cleaned with soap and water, linens changed, and new gown placed.  New catheter place on pt.    Family updated on wait time for pt bed.

## 2015-06-07 NOTE — ED Provider Notes (Signed)
CSN: [id number]     Arrival date & time 06/07/15  1444 History   First MD Initiated Contact with Patient 06/07/15 1447     Chief Complaint  Patient presents with  . Hypoglycemia     (Consider location/radiation/quality/duration/timing/severity/associated sxs/prior Treatment) Patient is a 69 y.o. male presenting with hypoglycemia. The history is provided by the patient.  Hypoglycemia Initial blood sugar:  39 Blood sugar after intervention:  118 Severity:  Severe Onset quality:  Gradual Timing:  Constant Progression:  Unchanged Chronicity:  New Diabetic status:  Controlled with insulin Context: decreased oral intake   Relieved by:  Nothing Ineffective treatments:  None tried Associated symptoms: no shortness of breath     Past Medical History  Diagnosis Date  . Diabetes mellitus without complication   . Hypertension   . GERD (gastroesophageal reflux disease)   . Hyperlipidemia   . Chronic kidney disease     kidney stones   Past Surgical History  Procedure Laterality Date  . No past surgeries    . Cystoscopy with retrograde pyelogram, ureteroscopy and stent placement Left 04/05/2014    Procedure: CYSTOSCOPY WITH RETROGRADE PYELOGRAM, POSSIBLE LEFT URETEROSCOPY AND LEFT STENT PLACEMENT;  Surgeon: Bjorn Pippin, MD;  Location: [facility];  Service: Urology;  Laterality: Left;  . Holmium laser application Left 04/05/2014    Procedure: HOLMIUM LASER APPLICATION;  Surgeon: Bjorn Pippin, MD;  Location: [facility];  Service: Urology;  Laterality: Left;  . Lithotripsy    . Kidney surgery     Family History  Problem Relation Age of Onset  . Stroke Mother   . Heart disease Father    History  Substance Use Topics  . Smoking status: Never Smoker   . Smokeless tobacco: Never Used  . Alcohol Use: No    Review of Systems  Constitutional: Negative for fever.  Respiratory: Negative for cough and shortness of breath.   All other systems reviewed and are negative.     Allergies  Bee  venom  Home Medications   Prior to Admission medications   Medication Sig Start Date End Date Taking? Authorizing Provider  amLODipine (NORVASC) 10 MG tablet Take 10 mg by mouth daily.   Yes Historical Provider, MD  aspirin 81 MG tablet Take 81 mg by mouth daily.   Yes Historical Provider, MD  benztropine (COGENTIN) 1 MG tablet Take 1 mg by mouth 2 (two) times daily.   Yes Historical Provider, MD  Cholecalciferol (VITAMIN D-3) 1000 UNITS CAPS Take 1 capsule by mouth daily.   Yes Historical Provider, MD  esomeprazole (NEXIUM) 20 MG capsule Take 20 mg by mouth daily as needed (for heartburn).    Yes Historical Provider, MD  haloperidol (HALDOL) 5 MG tablet Take 5-10 mg by mouth See admin instructions. Take1 tab in am, then take 2 tabs in pm   Yes Historical Provider, MD  hydrochlorothiazide (HYDRODIURIL) 12.5 MG tablet Take 12.5 mg by mouth daily.   Yes Historical Provider, MD  insulin detemir (LEVEMIR) 100 UNIT/ML injection Inject 40 Units into the skin at bedtime.   Yes Historical Provider, MD  metoprolol (LOPRESSOR) 100 MG tablet Take 100 mg by mouth 2 (two) times daily.   Yes Historical Provider, MD  pravastatin (PRAVACHOL) 40 MG tablet Take 40 mg by mouth every evening.    Yes Historical Provider, MD   BP 156/104 mmHg  Pulse 95  Temp(Src) 100.1 F (37.8 C) (Oral)  Resp 16  SpO2 99% Physical Exam  Constitutional: He is oriented to person,  place, and time. He appears well-developed and well-nourished. No distress.  HENT:  Head: Normocephalic and atraumatic.  Mouth/Throat: No oropharyngeal exudate.  Eyes: EOM are normal. Pupils are equal, round, and reactive to light.  Neck: Normal range of motion. Neck supple.  Cardiovascular: Normal rate and regular rhythm.  Exam reveals no friction rub.   No murmur heard. Pulmonary/Chest: Effort normal and breath sounds normal. No respiratory distress. He has no wheezes. He has no rales.  Abdominal: He exhibits no distension. There is no  tenderness. There is no rebound.  Musculoskeletal: Normal range of motion. He exhibits no edema.  Neurological: He is alert and oriented to person, place, and time. A cranial nerve deficit (mild dysarthria) is present. No sensory deficit. He exhibits normal muscle tone. GCS eye subscore is 4. GCS verbal subscore is 5. GCS motor subscore is 6.  Skin: He is not diaphoretic.  Nursing note and vitals reviewed.   ED Course  Procedures (including critical care time) Labs Review Labs Reviewed  CBC - Abnormal; Notable for the following:    RBC 3.85 (*)    Hemoglobin 10.8 (*)    HCT 32.8 (*)    All other components within normal limits  BASIC METABOLIC PANEL - Abnormal; Notable for the following:    Potassium 6.3 (*)    CO2 18 (*)    Glucose, Bld 124 (*)    BUN 48 (*)    Creatinine, Ser 4.74 (*)    Calcium 8.6 (*)    GFR calc non Af Amer 11 (*)    GFR calc Af Amer 13 (*)    All other components within normal limits  CBG MONITORING, ED - Abnormal; Notable for the following:    Glucose-Capillary 118 (*)    All other components within normal limits  URINALYSIS, ROUTINE W REFLEX MICROSCOPIC (NOT AT [facility])  I-STAT TROPOININ, ED  I-STAT CG4 LACTIC ACID, ED    Imaging Review Dg Chest 2 View  06/07/2015   CLINICAL DATA:  Slurred speech, LEFT-sided weakness, drawn lips, last seen normal at 1200 hours yesterday, hypoglycemia, history hypertension, diabetes mellitus, GERD, chronic kidney disease  EXAM: CHEST  2 VIEW  COMPARISON:  04/14/2015  FINDINGS: Minimal enlargement of cardiac silhouette.  Mediastinal contours and pulmonary vascularity normal.  Lungs clear.  No pleural effusion or pneumothorax.  Bones unremarkable.  IMPRESSION: Minimal enlargement of cardiac silhouette without acute infiltrate.   Electronically Signed   By: Ulyses Southward M.D.   On: 06/07/2015 15:43     EKG Interpretation   Date/Time:  Tuesday June 07 2015 15:30:22 EDT Ventricular Rate:  95 PR Interval:  190 QRS Duration:  77 QT Interval:  358 QTC Calculation: 450 R Axis:   -5 Text Interpretation:  Sinus rhythm Nonspecific T abnormalities, lateral  leads Minimal ST elevation, anterior leads T wave flattening V5-V6,  similar to prior Confirmed by Gwendolyn Grant  MD, Ladale Sherburn ([id number]) on 06/07/2015  3:35:42 PM      MDM   Final diagnoses:  Hypoglycemia  Hyperkalemia  Acute renal failure, unspecified acute renal failure type    69 year old male here with hypoglycemia. Family found him this afternoon he was not acting like himself. Last known normal was yesterday. Hypoglycemic with EMS with sugar of 39. Given half amp D50 increase in sugar. Family reports he likely hasn't had any medication since yesterday. Here sugar increased to 118, he has slurred speech here but it has improved per EMS. He is moving all extremities with no gross neurologic  deficits. Here creatinine is 4.74, which is double from baseline. Patient admitted for his acute renal failure. Hyperkalemia protocol order set utilized. No EKG changes.    Elwin Mocha, MD 06/07/15 1630

## 2015-06-07 NOTE — ED Notes (Signed)
Pt and family aware of bed assignment 

## 2015-06-07 NOTE — H&P (Addendum)
[facility] History and Physical  [name] [name] WLS:[id number] DOB: 02-01-1946 DOA: 06/07/2015   PCP: Aura Dials, MD    Chief Complaint: Confusion  HPI: [name] [name] is a 69 y.o. male with a past medical history of diabetes on insulin, schizophrenia, hypertension, chronic kidney disease stage IV, hyperlipidemia. The patient lives alone but his ex-wife goes every day and sets up his night and next morning's medications for him. When she went to check on him this afternoon, he was not opening the door. She was knocking on the window and asking him to open the door. He stated that he was not able to get up. Her son came and was able to get in the house and open the door.They found him laying in the bed appearing quite weak. He subsequently began getting a little confused. EMS arrived and checked his blood sugar and found it to be 37. He was given an amp of D50 and brought into the ER. In the ER he was found to have acute renal failure with hyperkalemia and a low-grade fever. Blood sugars have been stable ever since being given the D50. The patient cannot recall if he took his medications yesterday. He Is unable to tell me if he's had a poor appetite nausea vomiting or diarrhea. He is noted to have a low-grade fever in the ER but does not admit to any fevers or chills. Does not admit to any cough.   General: The patient denies anorexia, fever, weight loss Cardiac: Denies chest pain, syncope, palpitations, pedal edema  Respiratory: Denies cough, shortness of breath, wheezing GI: Denies severe indigestion/heartburn, abdominal pain, nausea, vomiting, diarrhea and constipation GU: Denies hematuria, incontinence, dysuria  Musculoskeletal: Denies arthritis  Skin: Denies suspicious skin lesions Neurologic: Denies focal weakness or numbness, change in vision Psychiatry: Denies depression or anxiety. Hematologic: no easy bruising or bleeding  All other systems reviewed and found to be  negative.  Past Medical History  Diagnosis Date  . Diabetes mellitus without complication   . Hypertension   . GERD (gastroesophageal reflux disease)   . Hyperlipidemia   . Chronic kidney disease     kidney stones    Past Surgical History  Procedure Laterality Date  . No past surgeries    . Cystoscopy with retrograde pyelogram, ureteroscopy and stent placement Left 04/05/2014    Procedure: CYSTOSCOPY WITH RETROGRADE PYELOGRAM, POSSIBLE LEFT URETEROSCOPY AND LEFT STENT PLACEMENT;  Surgeon: Bjorn Pippin, MD;  Location: [facility];  Service: Urology;  Laterality: Left;  . Holmium laser application Left 04/05/2014    Procedure: HOLMIUM LASER APPLICATION;  Surgeon: Bjorn Pippin, MD;  Location: [facility];  Service: Urology;  Laterality: Left;  . Lithotripsy    . Kidney surgery      Social History: does not smoke or drink alcohol Lives at home alone    Allergies  Allergen Reactions  . Bee Venom Anaphylaxis    Family history:   Family History  Problem Relation Age of Onset  . Stroke Mother   . Heart disease Father       Prior to Admission medications   Medication Sig Start Date End Date Taking? Authorizing Provider  amLODipine (NORVASC) 10 MG tablet Take 10 mg by mouth daily.   Yes Historical Provider, MD  aspirin 81 MG tablet Take 81 mg by mouth daily.   Yes Historical Provider, MD  benztropine (COGENTIN) 1 MG tablet Take 1 mg by mouth 2 (two) times daily.   Yes Historical Provider, MD  Cholecalciferol (VITAMIN D-3) 1000 UNITS CAPS Take 1 capsule by mouth daily.   Yes Historical Provider, MD  esomeprazole (NEXIUM) 20 MG capsule Take 20 mg by mouth daily as needed (for heartburn).    Yes Historical Provider, MD  haloperidol (HALDOL) 5 MG tablet Take 5-10 mg by mouth See admin instructions. Take1 tab in am, then take 2 tabs in pm   Yes Historical Provider, MD  hydrochlorothiazide (HYDRODIURIL) 12.5 MG tablet Take 12.5 mg by mouth daily.   Yes Historical Provider, MD  insulin detemir  (LEVEMIR) 100 UNIT/ML injection Inject 40 Units into the skin at bedtime.   Yes Historical Provider, MD  metoprolol (LOPRESSOR) 100 MG tablet Take 100 mg by mouth 2 (two) times daily.   Yes Historical Provider, MD  pravastatin (PRAVACHOL) 40 MG tablet Take 40 mg by mouth every evening.    Yes Historical Provider, MD     Physical Exam: Filed Vitals:   06/07/15 1457 06/07/15 1500  BP: 172/78 156/104  Pulse: 94 95  Temp: 100.1 F (37.8 C)   TempSrc: Oral   Resp: 22 16  SpO2: 100% 99%     General: alert, confused to time - no acute distress HEENT: Normocephalic and Atraumatic, Mucous membranes pink                PERRLA; EOM intact; No scleral icterus,                 Nares: Patent, Oropharynx: Clear, Fair Dentition                 Neck: FROM, no cervical lymphadenopathy, thyromegaly, carotid bruit or JVD;  Breasts: deferred CHEST WALL: No tenderness  CHEST: Normal respiration, clear to auscultation bilaterally  HEART: Regular rate and rhythm; no murmurs rubs or gallops  BACK: No kyphosis or scoliosis; no CVA tenderness  GI: Positive Bowel Sounds, soft, non-tender; no masses, no organomegaly Rectal Exam: deferred MSK: No cyanosis, clubbing, or edema Genitalia: not examined  SKIN:  no rash or ulceration  CNS: Alert and Oriented x 4, Nonfocal exam, CN 2-12 intact  Labs on Admission:  Basic Metabolic Panel:  Recent Labs Lab 06/07/15 1510  NA 138  K 6.3*  CL 109  CO2 18*  GLUCOSE 124*  BUN 48*  CREATININE 4.74*  CALCIUM 8.6*   Liver Function Tests: No results for input(s): AST, ALT, ALKPHOS, BILITOT, PROT, ALBUMIN in the last 168 hours. No results for input(s): LIPASE, AMYLASE in the last 168 hours. No results for input(s): AMMONIA in the last 168 hours. CBC:  Recent Labs Lab 06/07/15 1510  WBC 7.4  HGB 10.8*  HCT 32.8*  MCV 85.2  PLT 263   Cardiac Enzymes: No results for input(s): CKTOTAL, CKMB, CKMBINDEX, TROPONINI in the last 168 hours.  BNP (last 3  results) No results for input(s): BNP in the last 8760 hours.  ProBNP (last 3 results) No results for input(s): PROBNP in the last 8760 hours.  CBG:  Recent Labs Lab 06/07/15 1447  GLUCAP 118*    Radiological Exams on Admission: Dg Chest 2 View  06/07/2015   CLINICAL DATA:  Slurred speech, LEFT-sided weakness, drawn lips, last seen normal at 1200 hours yesterday, hypoglycemia, history hypertension, diabetes mellitus, GERD, chronic kidney disease  EXAM: CHEST  2 VIEW  COMPARISON:  04/14/2015  FINDINGS: Minimal enlargement of cardiac silhouette.  Mediastinal contours and pulmonary vascularity normal.  Lungs clear.  No pleural effusion or pneumothorax.  Bones unremarkable.  IMPRESSION: Minimal enlargement of  cardiac silhouette without acute infiltrate.   Electronically Signed   By: Ulyses Southward M.D.   On: 06/07/2015 15:43   Ct Head Wo Contrast  06/07/2015   CLINICAL DATA:  Slurred speech and LEFT-sided weakness. Altered mental status. Urinary incontinence. Personal history of chronic kidney disease.  EXAM: CT HEAD WITHOUT CONTRAST  TECHNIQUE: Contiguous axial images were obtained from the base of the skull through the vertex without intravenous contrast.  COMPARISON:  None.  FINDINGS: No mass lesion, mass effect, midline shift, hydrocephalus, hemorrhage. No acute territorial cortical ischemia/infarct. Atrophy and chronic ischemic white matter disease is present. Chronic appearing lacunar infarcts are present in the LEFT basal ganglia. Small calcification is present in the LEFT middle cerebral peduncle, incidentally noted. Bilateral lens extractions are noted.  The calvarium is intact. Mastoid air cells clear. Intracranial atherosclerosis. Visible paranasal sinuses are within normal limits.  IMPRESSION: No acute intracranial abnormality. 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   Electronically Signed   By: Andreas Newport M.D.   On: 06/07/2015 16:33     EKG: Independently reviewed. Sinus rhythm with no st-t wave changes  Assessment/Plan Principal Problem:   Acute renal failure/acidosis- CKD 4 - hydrate aggressively and follow- check FeNa- hold HCTZ   Active Problems: Confusion - slurred speech - due to hypoglycemia and ARF/ azotemia- confusion better but has poor memory of whether he is taking meds etc. - CT head negative for an acute infarct or bleed but old stroke seen- cont ASA and Statin    Hyperkalemia - due to ARF - giving fluids and kayexalate- no EGK changes- recheck this evening    Fever- 100.1 - check UA  - -  no cough, rash, diarrhea, vomiting- CXR clear - obtain blood cultures if temp > 101    Hypoglycemia/diabetes mellitus insulin requiring - due to use of Levemir in setting of renal failure? - check sugars Q`1 hrs until we are sure he is not becoming hypoglycemic - regular diet -Check A1c    HTN (hypertension) - cont Metoprolol and Norvasc    Schizophrenia - cont Haldol and Cogentin with dosing based on renal function   Anemia - check anemia panel  Code Status: DNR- has living will-  Family Communication: ex wife at bedside  DVT Prophylaxis: Heparin  Time spent: 50 min  Dasani Crear, MD [facility]  If 7PM-7AM, please contact night-coverage [email or web address] 06/07/2015, 4:41 PM

## 2015-06-07 NOTE — ED Notes (Signed)
Pt had one episode of urine incontinence in bed.  This RN cleaned the patient and changed gown and linens.  Admitting ordering condom catheter, which is being placed by tech at this time for urine collection and skin protection.

## 2015-06-07 NOTE — ED Notes (Signed)
Computer states pt CBG of 99 was taken at 1711, but result was collected at 16:07

## 2015-06-08 DIAGNOSIS — N179 Acute kidney failure, unspecified: Principal | ICD-10-CM

## 2015-06-08 LAB — GLUCOSE, CAPILLARY
GLUCOSE-CAPILLARY: 104 mg/dL — AB (ref 65–99)
GLUCOSE-CAPILLARY: 101 mg/dL — AB (ref 65–99)
Glucose-Capillary: 124 mg/dL — ABNORMAL HIGH (ref 65–99)
Glucose-Capillary: 188 mg/dL — ABNORMAL HIGH (ref 65–99)
Glucose-Capillary: 204 mg/dL — ABNORMAL HIGH (ref 65–99)
Glucose-Capillary: 274 mg/dL — ABNORMAL HIGH (ref 65–99)
Glucose-Capillary: 89 mg/dL (ref 65–99)

## 2015-06-08 LAB — CK: Total CK: 156 U/L (ref 49–397)

## 2015-06-08 LAB — BASIC METABOLIC PANEL
Anion gap: 7 (ref 5–15)
BUN: 39 mg/dL — AB (ref 6–20)
CO2: 22 mmol/L (ref 22–32)
Calcium: 8.3 mg/dL — ABNORMAL LOW (ref 8.9–10.3)
Chloride: 113 mmol/L — ABNORMAL HIGH (ref 101–111)
Creatinine, Ser: 4.2 mg/dL — ABNORMAL HIGH (ref 0.61–1.24)
GFR calc Af Amer: 15 mL/min — ABNORMAL LOW (ref >60)
GFR calc non Af Amer: 13 mL/min — ABNORMAL LOW (ref >60)
GLUCOSE: 114 mg/dL — AB (ref 65–99)
POTASSIUM: 4.4 mmol/L (ref 3.5–5.1)
Sodium: 142 mmol/L (ref 135–145)

## 2015-06-08 LAB — CBC
HCT: 28.8 % — ABNORMAL LOW (ref 39.0–52.0)
Hemoglobin: 9.4 g/dL — ABNORMAL LOW (ref 13.0–17.0)
MCH: 28.1 pg (ref 26.0–34.0)
MCHC: 32.6 g/dL (ref 30.0–36.0)
MCV: 86.2 fL (ref 78.0–100.0)
PLATELETS: 214 10*3/uL (ref 150–400)
RBC: 3.34 MIL/uL — ABNORMAL LOW (ref 4.22–5.81)
RDW: 13.4 % (ref 11.5–15.5)
WBC: 6.2 10*3/uL (ref 4.0–10.5)

## 2015-06-08 LAB — IRON AND TIBC
Iron: 38 ug/dL — ABNORMAL LOW (ref 45–182)
SATURATION RATIOS: 19 % (ref 17.9–39.5)
TIBC: 203 ug/dL — AB (ref 250–450)
UIBC: 165 ug/dL

## 2015-06-08 LAB — TSH: TSH: 1.498 u[IU]/mL (ref 0.350–4.500)

## 2015-06-08 LAB — HEMOGLOBIN A1C
HEMOGLOBIN A1C: 6.2 % — AB (ref 4.8–5.6)
Mean Plasma Glucose: 131 mg/dL

## 2015-06-08 LAB — RETICULOCYTES
RBC.: 3.34 MIL/uL — AB (ref 4.22–5.81)
Retic Count, Absolute: 30.1 10*3/uL (ref 19.0–186.0)
Retic Ct Pct: 0.9 % (ref 0.4–3.1)

## 2015-06-08 LAB — VITAMIN B12: VITAMIN B 12: 654 pg/mL (ref 180–914)

## 2015-06-08 LAB — FERRITIN: Ferritin: 484 ng/mL — ABNORMAL HIGH (ref 24–336)

## 2015-06-08 LAB — FOLATE: FOLATE: 19 ng/mL (ref >5.9)

## 2015-06-08 MED ORDER — VITAMIN D3 25 MCG (1000 UNIT) PO TABS
1000.0000 [IU] | ORAL_TABLET | Freq: Every day | ORAL | Status: DC
  Administered 2015-06-09 – 2015-06-11 (×3): 1000 [IU] via ORAL
  Filled 2015-06-08 (×3): qty 1

## 2015-06-08 MED ORDER — PANTOPRAZOLE SODIUM 40 MG PO TBEC
40.0000 mg | DELAYED_RELEASE_TABLET | Freq: Every day | ORAL | Status: DC
  Administered 2015-06-08 – 2015-06-11 (×4): 40 mg via ORAL
  Filled 2015-06-08 (×4): qty 1

## 2015-06-08 MED ORDER — VITAMIN D-3 25 MCG (1000 UT) PO CAPS: 1.0000 | ORAL_CAPSULE | Freq: Every day | ORAL | Status: DC

## 2015-06-08 MED ORDER — INSULIN ASPART 100 UNIT/ML ~~LOC~~ SOLN
0.0000 [IU] | Freq: Three times a day (TID) | SUBCUTANEOUS | Status: DC
  Administered 2015-06-08: 5 [IU] via SUBCUTANEOUS
  Administered 2015-06-09: 2 [IU] via SUBCUTANEOUS
  Administered 2015-06-09: 1 [IU] via SUBCUTANEOUS
  Administered 2015-06-09 – 2015-06-10 (×2): 2 [IU] via SUBCUTANEOUS
  Administered 2015-06-10 – 2015-06-11 (×3): 1 [IU] via SUBCUTANEOUS
  Administered 2015-06-11: 2 [IU] via SUBCUTANEOUS

## 2015-06-08 NOTE — Progress Notes (Signed)
Pt's R eye swollen and he reports impaired vision. Pt is not sure when this occurred, although pt's ex wife believes it happened with fall. Dr. Lendell Caprice notified. Will continue to monitor.   Carrie Mew, RN

## 2015-06-08 NOTE — Evaluation (Signed)
Physical Therapy Evaluation Patient Details Name: [name] [name] MRN: [id number] DOB: 1946-07-27 Today's Date: 06/08/2015   History of Present Illness  HPI: [name] [name] is a 69 y.o. male with a past medical history of diabetes on insulin, schizophrenia, hypertension, chronic kidney disease stage IV, hyperlipidemia. The patient lives alone but his ex-wife goes every day and sets up his night and next morning's medications for him. When she went to check on him this afternoon, he was not opening the door. She was knocking on the window and asking him to open the door. He stated that he was not able to get up. Her son came and was able to get in the house and open the door.They found him laying in the bed appearing quite weak. Admitted with hypoglycemia and acute renal failure  Clinical Impression   Pt admitted with above diagnosis. Pt currently with functional limitations due to the deficits listed below (see PT Problem List).  Pt will benefit from skilled PT to increase their independence and safety with mobility to allow discharge to the venue listed below.    Hopeful that as pt medically improves, his functional status will improve to the point that he can safely dc home, but at this point, must consider SNF    Follow Up Recommendations SNF    Equipment Recommendations  Rolling walker with 5" wheels;3in1 (PT)    Recommendations for Other Services OT consult     Precautions / Restrictions Precautions Precautions: Fall      Mobility  Bed Mobility Overal bed mobility: Modified Independent                Transfers Overall transfer level: Needs assistance Equipment used: Rolling walker (2 wheeled) Transfers: Sit to/from Stand Sit to Stand: Mod assist         General transfer comment: Mod assist to power up  Ambulation/Gait Ambulation/Gait assistance: Min assist;Mod assist Ambulation Distance (Feet):  (pivot steps bed to [facility] to recliner) Assistive device: Rolling  walker (2 wheeled) Gait Pattern/deviations: Shuffle;Ataxic;Trunk flexed     General Gait Details: Ataxic steps with erratic step width; very unsteady  Careers information officer    Modified Rankin (Stroke Patients Only)       Balance Overall balance assessment: Needs assistance           Standing balance-Leahy Scale: Poor                               Pertinent Vitals/Pain Pain Assessment: No/denies pain    Home Living Family/patient expects to be discharged to:: Private residence Living Arrangements: Alone Available Help at Discharge: Family;Available PRN/intermittently Type of Home: House Home Access:  (to be determined)     Home Layout:  (to be determined) Home Equipment:  (to be determined)      Prior Function Level of Independence: Independent               Hand Dominance        Extremity/Trunk Assessment   Upper Extremity Assessment: Overall WFL for tasks assessed           Lower Extremity Assessment: Generalized weakness         Communication   Communication: No difficulties  Cognition Arousal/Alertness: Awake/alert Behavior During Therapy: WFL for tasks assessed/performed Overall Cognitive Status: Within Functional Limits for tasks assessed (Quite distracted by need fo rBSC)  General Comments General comments (skin integrity, edema, etc.): Pt did have a large BM on [facility]    Exercises        Assessment/Plan    PT Assessment Patient needs continued PT services  PT Diagnosis Difficulty walking;Generalized weakness   PT Problem List Decreased strength;Decreased activity tolerance;Decreased balance;Decreased mobility;Decreased coordination;Decreased cognition;Decreased knowledge of use of DME;Decreased safety awareness;Decreased knowledge of precautions  PT Treatment Interventions DME instruction;Gait training;Stair training;Functional mobility training;Therapeutic  activities;Therapeutic exercise;Balance training;Neuromuscular re-education;Cognitive remediation;Patient/family education   PT Goals (Current goals can be found in the Care Plan section) Acute Rehab PT Goals Patient Stated Goal: wants to get better PT Goal Formulation: With patient Time For Goal Achievement: 06/22/15 Potential to Achieve Goals: Good    Frequency Min 3X/week   Barriers to discharge Decreased caregiver support lives alone; must be modified independent to dc home    Co-evaluation               End of Session Equipment Utilized During Treatment: Gait belt Activity Tolerance: Patient tolerated treatment well Patient left: in chair;with call bell/phone within reach;with chair alarm set Nurse Communication: Mobility status         Time: 1448-1513 (minus a few minutes for toileting) PT Time Calculation (min) (ACUTE ONLY): 25 min   Charges:   PT Evaluation $Initial PT Evaluation Tier I: 1 Procedure     PT G CodesOlen Pel 06/08/2015, 4:10 PM  Van Clines, PT  Acute Rehabilitation Services Pager [phone] Office [phone]

## 2015-06-08 NOTE — Progress Notes (Signed)
[facility] PROGRESS NOTE  [name] [name] ZCH:[id number] DOB: 1946/08/11 DOA: 06/07/2015 PCP: Aura Dials, MD  Assessment/Plan:  Principal Problem:   Acute renal failure: discussed with Dr. Lynda Rainwater. Creatinine ranges 4-5. Due for renal US. Will order. Continue IVF. FENa >3. Active Problems:   Hyperkalemia corrected   Fever etiology unclear   Hypoglycemia none further. Resume sensitive SSI   HTN (hypertension) controlled   Schizophrenia stable Weakness: check TSH. Continue IVF and get PT eval   HPI/Subjective: Legs feel weak. No dyspneA. Per son, has been more confused recently and difficult to understant  Objective: Filed Vitals:   06/08/15 2018  BP: 141/67  Pulse: 96  Temp: 98.9 F (37.2 C)  Resp: 18    Intake/Output Summary (Last 24 hours) at 06/08/15 2220 Last data filed at 06/08/15 2200  Gross per 24 hour  Intake 4227.08 ml  Output   2125 ml  Net 2102.08 ml   Filed Weights   06/07/15 2015  Weight: 83.9 kg (184 lb 15.5 oz)    Exam:   General:  In chair. Cooperative. difficlut to understand  Cardiovascular RRR  Respiratory: CTA without WRR  Abdomen: S. Nt, nd  Ext: no CCE  Basic Metabolic Panel:  Recent Labs Lab 06/07/15 1510 06/07/15 1512 06/07/15 2030 06/08/15 0610  NA 138  --  142 142  K 6.3*  --  4.4 4.4  CL 109  --  112* 113*  CO2 18*  --  20* 22  GLUCOSE 124*  --  133* 114*  BUN 48*  --  43* 39*  CREATININE 4.74* 4.75* 4.42* 4.20*  CALCIUM 8.6*  --  8.5* 8.3*   Liver Function Tests: No results for input(s): AST, ALT, ALKPHOS, BILITOT, PROT, ALBUMIN in the last 168 hours. No results for input(s): LIPASE, AMYLASE in the last 168 hours. No results for input(s): AMMONIA in the last 168 hours. CBC:  Recent Labs Lab 06/07/15 1510 06/07/15 1512 06/08/15 0610  WBC 7.4 7.5 6.2  HGB 10.8* 10.7* 9.4*  HCT 32.8* 32.3* 28.8*  MCV 85.2 85.7 86.2  PLT 263 255 214   Cardiac Enzymes:  Recent Labs Lab 06/08/15 1710   CKTOTAL 156   BNP (last 3 results) No results for input(s): BNP in the last 8760 hours.  ProBNP (last 3 results) No results for input(s): PROBNP in the last 8760 hours.  CBG:  Recent Labs Lab 06/08/15 0427 06/08/15 0729 06/08/15 1110 06/08/15 1625 06/08/15 2016  GLUCAP 101* 89 204* 274* 188*    Recent Results (from the past 240 hour(s))  Urine culture     Status: None (Preliminary result)   Collection Time: 06/07/15  6:22 PM  Result Value Ref Range Status   Specimen Description URINE, CLEAN CATCH  Final   Special Requests Normal  Final   Culture TOO YOUNG TO READ  Final   Report Status PENDING  Incomplete     Studies: Dg Chest 2 View  06/07/2015   CLINICAL DATA:  Slurred speech, LEFT-sided weakness, drawn lips, last seen normal at 1200 hours yesterday, hypoglycemia, history hypertension, diabetes mellitus, GERD, chronic kidney disease  EXAM: CHEST  2 VIEW  COMPARISON:  04/14/2015  FINDINGS: Minimal enlargement of cardiac silhouette.  Mediastinal contours and pulmonary vascularity normal.  Lungs clear.  No pleural effusion or pneumothorax.  Bones unremarkable.  IMPRESSION: Minimal enlargement of cardiac silhouette without acute infiltrate.   Electronically Signed   By: Ulyses Southward M.D.   On: 06/07/2015 15:43   Ct Head  Wo Contrast  06/07/2015   CLINICAL DATA:  Slurred speech and LEFT-sided weakness. Altered mental status. Urinary incontinence. Personal history of chronic kidney disease.  EXAM: CT HEAD WITHOUT CONTRAST  TECHNIQUE: Contiguous axial images were obtained from the base of the skull through the vertex without intravenous contrast.  COMPARISON:  None.  FINDINGS: No mass lesion, mass effect, midline shift, hydrocephalus, hemorrhage. No acute territorial cortical ischemia/infarct. Atrophy and chronic ischemic white matter disease is present. Chronic appearing lacunar infarcts are present in the LEFT basal ganglia. Small calcification is present in the LEFT middle cerebral  peduncle, incidentally noted. Bilateral lens extractions are noted.  The calvarium is intact. Mastoid air cells clear. Intracranial atherosclerosis. Visible paranasal sinuses are within normal limits.  IMPRESSION: No acute intracranial abnormality. 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   Electronically Signed   By: Andreas Newport M.D.   On: 06/07/2015 16:33    Scheduled Meds: . amLODipine  10 mg Oral Daily  . aspirin  81 mg Oral Daily  . benztropine  1 mg Oral BID  . [START ON 06/09/2015] cholecalciferol  1,000 Units Oral Daily  . haloperidol  10 mg Oral QHS  . haloperidol  5 mg Oral Daily  . heparin  5,000 Units Subcutaneous 3 times per day  . insulin aspart  0-9 Units Subcutaneous TID WC  . metoprolol  100 mg Oral BID  . pantoprazole  40 mg Oral Daily  . pravastatin  40 mg Oral QPM   Continuous Infusions: . sodium chloride 125 mL/hr (06/08/15 2149)    Time spent: 25 minutes  [name] L  [facility] [email or web address], password [facility] 06/08/2015, 10:20 PM  LOS: 1 day

## 2015-06-09 ENCOUNTER — Inpatient Hospital Stay (HOSPITAL_COMMUNITY): Payer: Medicare Other

## 2015-06-09 DIAGNOSIS — G9340 Encephalopathy, unspecified: Secondary | ICD-10-CM

## 2015-06-09 LAB — GLUCOSE, CAPILLARY
GLUCOSE-CAPILLARY: 137 mg/dL — AB (ref 65–99)
GLUCOSE-CAPILLARY: 173 mg/dL — AB (ref 65–99)
Glucose-Capillary: 137 mg/dL — ABNORMAL HIGH (ref 65–99)
Glucose-Capillary: 165 mg/dL — ABNORMAL HIGH (ref 65–99)
Glucose-Capillary: 188 mg/dL — ABNORMAL HIGH (ref 65–99)
Glucose-Capillary: 156 mg/dL — ABNORMAL HIGH (ref 65–99)

## 2015-06-09 LAB — RENAL FUNCTION PANEL
Albumin: 2.7 g/dL — ABNORMAL LOW (ref 3.5–5.0)
Anion gap: 6 (ref 5–15)
BUN: 36 mg/dL — AB (ref 6–20)
CO2: 24 mmol/L (ref 22–32)
Calcium: 8.3 mg/dL — ABNORMAL LOW (ref 8.9–10.3)
Chloride: 116 mmol/L — ABNORMAL HIGH (ref 101–111)
Creatinine, Ser: 3.94 mg/dL — ABNORMAL HIGH (ref 0.61–1.24)
GFR calc Af Amer: 17 mL/min — ABNORMAL LOW (ref >60)
GFR calc non Af Amer: 14 mL/min — ABNORMAL LOW (ref >60)
GLUCOSE: 150 mg/dL — AB (ref 65–99)
PHOSPHORUS: 3.2 mg/dL (ref 2.5–4.6)
Potassium: 4.4 mmol/L (ref 3.5–5.1)
SODIUM: 146 mmol/L — AB (ref 135–145)

## 2015-06-09 LAB — URINE CULTURE: Special Requests: NORMAL

## 2015-06-09 MED ORDER — METOPROLOL TARTRATE 100 MG PO TABS
100.0000 mg | ORAL_TABLET | Freq: Two times a day (BID) | ORAL | Status: DC
  Administered 2015-06-09 – 2015-06-11 (×5): 100 mg via ORAL
  Filled 2015-06-09 (×6): qty 1

## 2015-06-09 MED ORDER — SODIUM CHLORIDE 0.45 % IV SOLN
INTRAVENOUS | Status: DC
Start: 2015-06-09 — End: 2015-06-10
  Administered 2015-06-09: 125 mL/h via INTRAVENOUS
  Administered 2015-06-09: 1000 mL via INTRAVENOUS
  Administered 2015-06-10: 125 mL/h via INTRAVENOUS

## 2015-06-09 MED ORDER — HALOPERIDOL 1 MG PO TABS
1.0000 mg | ORAL_TABLET | Freq: Every day | ORAL | Status: DC
  Administered 2015-06-09 – 2015-06-10 (×2): 1 mg via ORAL
  Filled 2015-06-09 (×3): qty 1

## 2015-06-09 MED ORDER — AMLODIPINE BESYLATE 10 MG PO TABS
10.0000 mg | ORAL_TABLET | Freq: Every day | ORAL | Status: DC
  Administered 2015-06-09 – 2015-06-11 (×3): 10 mg via ORAL
  Filled 2015-06-09 (×3): qty 1

## 2015-06-09 MED ORDER — CIPROFLOXACIN HCL 0.3 % OP SOLN
2.0000 [drp] | OPHTHALMIC | Status: DC
  Administered 2015-06-09 – 2015-06-11 (×12): 2 [drp] via OPHTHALMIC
  Filled 2015-06-09: qty 2.5

## 2015-06-09 NOTE — Clinical Social Work Note (Signed)
Clinical Social Work Assessment  Patient Details  Name: [name] [name] MRN: [id number] Date of Birth: 27-Apr-1946  Date of referral:  06/09/15               Reason for consult:  Facility Placement                Permission sought to share information with:    Permission granted to share information::  Yes, Verbal Permission Granted  Name::        Agency::  SNF facilities in [facility]  Relationship::     Contact Information:     Housing/Transportation Living arrangements for the past 2 months:  Single Family Home Source of Information:  Patient Patient Interpreter Needed:  None Criminal Activity/Legal Involvement Pertinent to Current Situation/Hospitalization:  No - Comment as needed Significant Relationships:  Adult Children, Significant Other Lives with:  Self Do you feel safe going back to the place where you live?  Yes Need for family participation in patient care:  Yes (Comment) (Patient seemed like he was a little confused and was not able to focus on conversation with CSW)  Care giving concerns:  Patient states he lives alone and does not have anyone living with him.   Social Worker assessment / plan:  Patient is a 69 year old male who lives on his own.  Patient was quiet and was not conversing much with CSW, patient states he feels okay.  Patient was watching TV awake in his bed, and did make eye contact with CSW.  CSW discussed going to SNF for short term rehab based on PT recommendations.  Patient was explained role of CSW and to help with discharge planning for SNF for short term rehab.  Patient was explained how insurance pays for his stay, and he agreed for CSW to start the SNF placement process.  Patient lives in [place] and would prefer a facility in [place].  Employment status:  Retired Database administrator PT Recommendations:  Skilled Nursing Facility Information / Referral to community resources:     Patient/Family's Response  to care:  Patient in agreement to going to SNF for short term rehab.  Patient/Family's Understanding of and Emotional Response to Diagnosis, Current Treatment, and Prognosis: Patient did not seem aware of his diagnosis, but understood that he need to receive some short term rehab.  Emotional Assessment Appearance:  Appears stated age Attitude/Demeanor/Rapport:    Affect (typically observed):  Quiet, Flat, Stable Orientation:  Oriented to Self, Oriented to Place Alcohol / Substance use:  Not Applicable Psych involvement (Current and /or in the community):  No (Comment)  Discharge Needs  Concerns to be addressed:  Lack of Support, Adjustment to Illness Readmission within the last 30 days:  No Current discharge risk:  Lack of support system, Lives alone Barriers to Discharge:  No Barriers Identified   [place] Constable 06/09/2015, 4:51 PM

## 2015-06-09 NOTE — Progress Notes (Signed)
[facility] PROGRESS NOTE  REAL CONA UEA:[id number] DOB: [birth year] DOA: 06/07/2015 PCP: Aura Dials, MD  Assessment/Plan:  Principal Problem:   Acute renal failure: discussed with Dr. Lynda Rainwater. Creatinine ranges 4-5. Renal US without obstruction. Improving with hydration Active Problems: Still encephalopathic. Will get mri brain and d/c haldol except 1 mg qhs   Hyperkalemia corrected   Fever etiology unclear   Hypoglycemia none further. Resume sensitive SSI   HTN (hypertension) controlled   Schizophrenia stable Weakness: w/u neg thus far   HPI/Subjective: Per family still difficult to understand and more sleepy than baseline  Objective: Filed Vitals:   06/09/15 0957  BP: 196/77  Pulse: 84  Temp:   Resp: 17    Intake/Output Summary (Last 24 hours) at 06/09/15 1424 Last data filed at 06/09/15 1405  Gross per 24 hour  Intake 4175.75 ml  Output   1800 ml  Net 2375.75 ml   Filed Weights   06/07/15 2015  Weight: 83.9 kg (184 lb 15.5 oz)    Exam:   General:  Asleep. Arousable. Difficult to understand. Falls back asleep  Cardiovascular RRR without MGR  Respiratory: CTA without WRR  Abdomen: S. Nt, nd  Ext: no CCE  Basic Metabolic Panel:  Recent Labs Lab 06/07/15 1510 06/07/15 1512 06/07/15 2030 06/08/15 0610 06/09/15 0524  NA 138  --  142 142 146*  K 6.3*  --  4.4 4.4 4.4  CL 109  --  112* 113* 116*  CO2 18*  --  20* 22 24  GLUCOSE 124*  --  133* 114* 150*  BUN 48*  --  43* 39* 36*  CREATININE 4.74* 4.75* 4.42* 4.20* 3.94*  CALCIUM 8.6*  --  8.5* 8.3* 8.3*  PHOS  --   --   --   --  3.2   Liver Function Tests:  Recent Labs Lab 06/09/15 0524  ALBUMIN 2.7*   No results for input(s): LIPASE, AMYLASE in the last 168 hours. No results for input(s): AMMONIA in the last 168 hours. CBC:  Recent Labs Lab 06/07/15 1510 06/07/15 1512 06/08/15 0610  WBC 7.4 7.5 6.2  HGB 10.8* 10.7* 9.4*  HCT 32.8* 32.3* 28.8*  MCV 85.2 85.7  86.2  PLT 263 255 214   Cardiac Enzymes:  Recent Labs Lab 06/08/15 1710  CKTOTAL 156   BNP (last 3 results) No results for input(s): BNP in the last 8760 hours.  ProBNP (last 3 results) No results for input(s): PROBNP in the last 8760 hours.  CBG:  Recent Labs Lab 06/08/15 2016 06/09/15 0003 06/09/15 0431 06/09/15 0726 06/09/15 1133  GLUCAP 188* 173* 137* 137* 165*    Recent Results (from the past 240 hour(s))  Urine culture     Status: None   Collection Time: 06/07/15  6:22 PM  Result Value Ref Range Status   Specimen Description URINE, CLEAN CATCH  Final   Special Requests Normal  Final   Culture   Final    MULTIPLE SPECIES PRESENT, SUGGEST RECOLLECTION IF CLINICALLY INDICATED   Report Status 06/09/2015 FINAL  Final     Studies: Dg Chest 2 View  06/07/2015   CLINICAL DATA:  Slurred speech, LEFT-sided weakness, drawn lips, last seen normal at 1200 hours yesterday, hypoglycemia, history hypertension, diabetes mellitus, GERD, chronic kidney disease  EXAM: CHEST  2 VIEW  COMPARISON:  04/14/2015  FINDINGS: Minimal enlargement of cardiac silhouette.  Mediastinal contours and pulmonary vascularity normal.  Lungs clear.  No pleural effusion or pneumothorax.  Bones unremarkable.  IMPRESSION: Minimal enlargement of cardiac silhouette without acute infiltrate.   Electronically Signed   By: Ulyses Southward M.D.   On: 06/07/2015 15:43   Ct Head Wo Contrast  06/07/2015   CLINICAL DATA:  Slurred speech and LEFT-sided weakness. Altered mental status. Urinary incontinence. Personal history of chronic kidney disease.  EXAM: CT HEAD WITHOUT CONTRAST  TECHNIQUE: Contiguous axial images were obtained from the base of the skull through the vertex without intravenous contrast.  COMPARISON:  None.  FINDINGS: No mass lesion, mass effect, midline shift, hydrocephalus, hemorrhage. No acute territorial cortical ischemia/infarct. Atrophy and chronic ischemic white matter disease is present. Chronic  appearing lacunar infarcts are present in the LEFT basal ganglia. Small calcification is present in the LEFT middle cerebral peduncle, incidentally noted. Bilateral lens extractions are noted.  The calvarium is intact. Mastoid air cells clear. Intracranial atherosclerosis. Visible paranasal sinuses are within normal limits.  IMPRESSION: No acute intracranial abnormality. 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   Electronically Signed   By: Andreas Newport M.D.   On: 06/07/2015 16:33   US Renal  06/09/2015   CLINICAL DATA:  Acute renal failure  EXAM: RENAL / URINARY TRACT ULTRASOUND COMPLETE  COMPARISON:  06/11/2014  FINDINGS: Right Kidney:  Length: 11 cm. Echogenicity within normal limits. No mass or hydronephrosis visualized.  Left Kidney:  Length: 10.8 cm. Echogenicity within normal limits. No mass or hydronephrosis visualized.  Bladder:  Appears normal for degree of bladder distention.  IMPRESSION: 1. Normal renal sonogram.   Electronically Signed   By: Signa Kell M.D.   On: 06/09/2015 11:00    Scheduled Meds: . amLODipine  10 mg Oral Daily  . aspirin  81 mg Oral Daily  . benztropine  1 mg Oral BID  . cholecalciferol  1,000 Units Oral Daily  . ciprofloxacin  2 drop Right Eye Q4H while awake  . haloperidol  1 mg Oral QHS  . heparin  5,000 Units Subcutaneous 3 times per day  . insulin aspart  0-9 Units Subcutaneous TID WC  . metoprolol  100 mg Oral BID  . pantoprazole  40 mg Oral Daily  . pravastatin  40 mg Oral QPM   Continuous Infusions: . sodium chloride 125 mL/hr (06/09/15 5929)    Time spent: 25 minutes  Madlyn Crosby L  [facility] [email or web address], password [email or web address] 06/09/2015, 2:24 PM  LOS: 2 days

## 2015-06-10 LAB — CBC
HEMATOCRIT: 27.7 % — AB (ref 39.0–52.0)
Hemoglobin: 9.1 g/dL — ABNORMAL LOW (ref 13.0–17.0)
MCH: 28.9 pg (ref 26.0–34.0)
MCHC: 32.9 g/dL (ref 30.0–36.0)
MCV: 87.9 fL (ref 78.0–100.0)
Platelets: 180 10*3/uL (ref 150–400)
RBC: 3.15 MIL/uL — ABNORMAL LOW (ref 4.22–5.81)
RDW: 13.4 % (ref 11.5–15.5)
WBC: 6 10*3/uL (ref 4.0–10.5)

## 2015-06-10 LAB — BASIC METABOLIC PANEL
ANION GAP: 8 (ref 5–15)
BUN: 29 mg/dL — ABNORMAL HIGH (ref 6–20)
CALCIUM: 8 mg/dL — AB (ref 8.9–10.3)
CO2: 19 mmol/L — ABNORMAL LOW (ref 22–32)
Chloride: 114 mmol/L — ABNORMAL HIGH (ref 101–111)
Creatinine, Ser: 3.75 mg/dL — ABNORMAL HIGH (ref 0.61–1.24)
GFR calc Af Amer: 18 mL/min — ABNORMAL LOW (ref >60)
GFR calc non Af Amer: 15 mL/min — ABNORMAL LOW (ref >60)
Glucose, Bld: 132 mg/dL — ABNORMAL HIGH (ref 65–99)
Potassium: 4.2 mmol/L (ref 3.5–5.1)
SODIUM: 141 mmol/L (ref 135–145)

## 2015-06-10 LAB — GLUCOSE, CAPILLARY
GLUCOSE-CAPILLARY: 129 mg/dL — AB (ref 65–99)
GLUCOSE-CAPILLARY: 134 mg/dL — AB (ref 65–99)
GLUCOSE-CAPILLARY: 143 mg/dL — AB (ref 65–99)
Glucose-Capillary: 117 mg/dL — ABNORMAL HIGH (ref 65–99)
Glucose-Capillary: 166 mg/dL — ABNORMAL HIGH (ref 65–99)
Glucose-Capillary: 128 mg/dL — ABNORMAL HIGH (ref 65–99)

## 2015-06-10 LAB — AMMONIA: Ammonia: 21 umol/L (ref 9–35)

## 2015-06-10 MED ORDER — SODIUM CHLORIDE 0.9 % IJ SOLN: 3.0000 mL | INTRAMUSCULAR | Status: DC | PRN

## 2015-06-10 MED ORDER — DARBEPOETIN ALFA 40 MCG/0.4ML IJ SOSY
40.0000 ug | PREFILLED_SYRINGE | INTRAMUSCULAR | Status: DC
  Administered 2015-06-10: 40 ug via SUBCUTANEOUS
  Filled 2015-06-10: qty 0.4

## 2015-06-10 MED ORDER — DARBEPOETIN ALFA 40 MCG/0.4ML IJ SOSY
40.0000 ug | PREFILLED_SYRINGE | INTRAMUSCULAR | Status: DC
  Filled 2015-06-10: qty 0.4

## 2015-06-10 MED ORDER — SODIUM CHLORIDE 0.9 % IJ SOLN
3.0000 mL | Freq: Two times a day (BID) | INTRAMUSCULAR | Status: DC
  Administered 2015-06-10 – 2015-06-11 (×3): 3 mL via INTRAVENOUS

## 2015-06-10 MED ORDER — HYDRALAZINE HCL 20 MG/ML IJ SOLN
10.0000 mg | Freq: Four times a day (QID) | INTRAMUSCULAR | Status: DC | PRN
  Administered 2015-06-10: 10 mg via INTRAVENOUS
  Filled 2015-06-10: qty 1

## 2015-06-10 MED ORDER — SODIUM CHLORIDE 0.9 % IV SOLN: 250.0000 mL | INTRAVENOUS | Status: DC | PRN

## 2015-06-10 MED ORDER — HYDRALAZINE HCL 25 MG PO TABS
25.0000 mg | ORAL_TABLET | Freq: Three times a day (TID) | ORAL | Status: DC
  Administered 2015-06-10 – 2015-06-11 (×5): 25 mg via ORAL
  Filled 2015-06-10 (×6): qty 1

## 2015-06-10 NOTE — Progress Notes (Signed)
MEDICATION RELATED CONSULT NOTE - INITIAL   Pharmacy Consult for Aranesp Indication: Anemia of Chronic Kidney Disease  Allergies  Allergen Reactions  . Bee Venom Anaphylaxis    Patient Measurements: Height: 5' 10.5" (179.1 cm) Weight: 184 lb 15.5 oz (83.9 kg) IBW/kg (Calculated) : 74.15   Vital Signs: Temp: 99.1 F (37.3 C) (06/24 0902) Temp Source: Oral (06/24 0902) BP: 179/75 mmHg (06/24 0902) Pulse Rate: 89 (06/24 0902) Intake/Output from previous day: 06/23 0701 - 06/24 0700 In: 702 [P.O.:702] Out: 500 [Urine:500] Intake/Output from this shift:    Labs:  Recent Labs  06/07/15 1512 06/07/15 1822  06/08/15 0610 06/09/15 0524 06/10/15 0617  WBC 7.5  --   --  6.2  --  6.0  HGB 10.7*  --   --  9.4*  --  9.1*  HCT 32.3*  --   --  28.8*  --  27.7*  PLT 255  --   --  214  --  180  CREATININE 4.75*  --   < > 4.20* 3.94* 3.75*  LABCREA  --  77.81  --   --   --   --   PHOS  --   --   --   --  3.2  --   ALBUMIN  --   --   --   --  2.7*  --   < > = values in this interval not displayed. Estimated Creatinine Clearance: 19.5 mL/min (by C-G formula based on Cr of 3.75).   Microbiology: Recent Results (from the past 720 hour(s))  Urine culture     Status: None   Collection Time: 06/07/15  6:22 PM  Result Value Ref Range Status   Specimen Description URINE, CLEAN CATCH  Final   Special Requests Normal  Final   Culture   Final    MULTIPLE SPECIES PRESENT, SUGGEST RECOLLECTION IF CLINICALLY INDICATED   Report Status 06/09/2015 FINAL  Final    Medical History: Past Medical History  Diagnosis Date  . Diabetes mellitus without complication   . Hypertension   . GERD (gastroesophageal reflux disease)   . Hyperlipidemia   . Chronic kidney disease     kidney stones    Medications:  Prescriptions prior to admission  Medication Sig Dispense Refill Last Dose  . amLODipine (NORVASC) 10 MG tablet Take 10 mg by mouth daily.   Past Week at Unknown time  . aspirin 81 MG  tablet Take 81 mg by mouth daily.   Past Week at Unknown time  . benztropine (COGENTIN) 1 MG tablet Take 1 mg by mouth 2 (two) times daily.   Past Week at Unknown time  . Cholecalciferol (VITAMIN D-3) 1000 UNITS CAPS Take 1 capsule by mouth daily.   Past Week at Unknown time  . esomeprazole (NEXIUM) 20 MG capsule Take 20 mg by mouth daily as needed (for heartburn).    Past Week at Unknown time  . haloperidol (HALDOL) 5 MG tablet Take 5-10 mg by mouth See admin instructions. Take1 tab in am, then take 2 tabs in pm   Past Week at Unknown time  . hydrochlorothiazide (HYDRODIURIL) 12.5 MG tablet Take 12.5 mg by mouth daily.   Past Week at Unknown time  . insulin detemir (LEVEMIR) 100 UNIT/ML injection Inject 40 Units into the skin at bedtime.   Past Week at Unknown time  . metoprolol (LOPRESSOR) 100 MG tablet Take 100 mg by mouth 2 (two) times daily.   Past Week at 0800  .  pravastatin (PRAVACHOL) 40 MG tablet Take 40 mg by mouth every evening.    Past Week at Unknown time   Scheduled:  . amLODipine  10 mg Oral Daily  . aspirin  81 mg Oral Daily  . benztropine  1 mg Oral BID  . cholecalciferol  1,000 Units Oral Daily  . ciprofloxacin  2 drop Right Eye Q4H while awake  . haloperidol  1 mg Oral QHS  . heparin  5,000 Units Subcutaneous 3 times per day  . hydrALAZINE  25 mg Oral TID  . insulin aspart  0-9 Units Subcutaneous TID WC  . metoprolol  100 mg Oral BID  . pantoprazole  40 mg Oral Daily  . pravastatin  40 mg Oral QPM  . sodium chloride  3 mL Intravenous Q12H    Assessment: 69 y.o male with anemia of chronic kdney disease.  Today Hgb is 9.1. Hgb has decreased since 03/2015 when Hgb 11.2>10.8(May 2016)>>this admit Hgb 10.7>9.4>9.1 today.  Anemia panel drawn on 6/22>> Iron =38 low, TIBC 203 low, Ferritin 484 (high), Reticulocytes count = 30.1 (wnl), Vitamin B12 wnl, Folate wnl. Weight = 83.9 kg.  Aranesp dosing in non- ICU patient with anemia of CKD is 40 mcg (0.45 mcg/Kg) SQ weekly at 1800  for up to 4 doses.    Goal of Therapy:  Sustainable Hgb >11   Plan:  Aranesp 40 mcg (0.45 mcg/Kg) SQ weekly at 1800 for up to 4 doses.  CBC at least weekly and monitor reticulocyte counts for expected increase in reticulocyte counts, hemoglobin and hematocrit.  If sustainable Hgb is > 11 gm/dL, discontinue growth factor order.  Consider adding oral iron supplement (if started, also order a stool softener or laxative).   Thank you for allowing pharmacy to be part of this patients care team. Noah Delaine, RPh Clinical Pharmacist Pager: [phone] 06/10/2015,11:12 AM

## 2015-06-10 NOTE — Progress Notes (Signed)
Physical Therapy Treatment Patient Details Name: LENNELL FLAUGHER MRN: [id number] DOB: March 18, 1946 Today's Date: 06/10/2015    History of Present Illness HPI: DREVYN ROEBUCK is a 69 y.o. male with a past medical history of diabetes on insulin, schizophrenia, hypertension, chronic kidney disease stage IV, hyperlipidemia.  Hypoglycemia and acute renal failure.    PT Comments    Pt was able to be seen for gait and there ex despite being tired from being up in chair for several hours today. He is with son who is supportive and appears to be involved with his care.  Has plan for SNF but not sure when discharge is taking place.  Will be able to see ongoing for PT to continue to increase endurance and improve balance.  Follow Up Recommendations  SNF     Equipment Recommendations  Rolling walker with 5" wheels;3in1 (PT)    Recommendations for Other Services OT consult     Precautions / Restrictions Precautions Precautions: Fall Restrictions Weight Bearing Restrictions: No    Mobility  Bed Mobility Overal bed mobility: Modified Independent                Transfers Overall transfer level: Needs assistance Equipment used: Rolling walker (2 wheeled) Transfers: Sit to/from [employer] Sit to Stand: Min assist Stand pivot transfers: Min assist       General transfer comment: able to stand with help and needs safety supervision  Ambulation/Gait Ambulation/Gait assistance: Min assist Ambulation Distance (Feet): 40 Feet Assistive device: Rolling walker (2 wheeled) Gait Pattern/deviations: Step-through pattern;Wide base of support;Drifts right/left Gait velocity: slower Gait velocity interpretation: Below normal speed for age/gender General Gait Details: mildly ataxic with short wide steops   Stairs            Wheelchair Mobility    Modified Rankin (Stroke Patients Only)       Balance Overall balance assessment: Needs assistance            Standing balance-Leahy Scale: Poor                      Cognition Arousal/Alertness: Awake/alert Behavior During Therapy: WFL for tasks assessed/performed Overall Cognitive Status: Within Functional Limits for tasks assessed                      Exercises General Exercises - Lower Extremity Ankle Circles/Pumps: AROM;Both;10 reps Long Arc Quad: Strengthening;Both;10 reps Heel Slides: Strengthening;Both;10 reps Hip Flexion/Marching: AROM;Both;10 reps    General Comments General comments (skin integrity, edema, etc.): Son in attendance for treatment      Pertinent Vitals/Pain Pain Assessment: No/denies pain    Home Living                      Prior Function            PT Goals (current goals can now be found in the care plan section) Acute Rehab PT Goals Patient Stated Goal: Go home  Progress towards PT goals: Progressing toward goals    Frequency  Min 3X/week    PT Plan Current plan remains appropriate    Co-evaluation             End of Session Equipment Utilized During Treatment: Gait belt Activity Tolerance: Patient tolerated treatment well Patient left: in bed;with bed alarm set;with family/visitor present;with call bell/phone within reach     Time: 3817-7116 PT Time Calculation (min) (ACUTE ONLY): 16 min  Charges:  $  Gait Training: 8-22 mins                    G Codes:      Ivar Drape 06-15-2015, 5:29 PM  Samul Dada, PT MS Acute Rehab Dept. Number: [facility] [id number] and [facility] [phone]

## 2015-06-10 NOTE — Clinical Social Work Placement (Signed)
   CLINICAL SOCIAL WORK PLACEMENT  NOTE  Date:  06/10/2015  Patient Details  Name: [name] [name] MRN: [id number] Date of Birth: 15-Dec-1946  Clinical Social Work is seeking post-discharge placement for this patient at the Skilled  Nursing Facility level of care (*CSW will initial, date and re-position this form in  chart as items are completed):  Yes   Patient/family provided with [place] Clinical Social Work Department's list of facilities offering this level of care within the geographic area requested by the patient (or if unable, by the patient's family).  Yes   Patient/family informed of their freedom to choose among providers that offer the needed level of care, that participate in Medicare, Medicaid or managed care program needed by the patient, have an available bed and are willing to accept the patient.  Yes   Patient/family informed of [place]'s ownership interest in [facility] and [facility], as well as of the fact that they are under no obligation to receive care at these facilities.  PASRR submitted to EDS on 06/10/15     PASRR number received on       Existing PASRR number confirmed on 06/10/15     FL2 transmitted to all facilities in geographic area requested by pt/family on       FL2 transmitted to all facilities within larger geographic area on       Patient informed that his/her managed care company has contracts with or will negotiate with certain facilities, including the following:        Yes   Patient/family informed of bed offers received.  Patient chooses bed at       Physician recommends and patient chooses bed at      Patient to be transferred to   on  .  Patient to be transferred to facility by       Patient family notified on   of transfer.  Name of family member notified:        PHYSICIAN Please sign FL2, Please sign DNR     Additional Comment:    _______________________________________________ Darleene Cleaver,  LCSWA 06/10/2015, 5:31 PM

## 2015-06-10 NOTE — Care Management Note (Addendum)
Case Management Note  Patient Details  Name: [name] [name] MRN: [id number] Date of Birth: 1946-06-08  Subjective/Objective:               CM following for progression and d/c planing.     Action/Plan: 06/10/2015 Met with pt and his plan is to d/c to home will check on recommendations from PT, pt states that he lives alone however his ex wife and two sons assist as needed. He states that his ex wife is contacting "[employer]" for possible services.  Per CSW note, by CSW Randall Hiss A, this pt plans to d/c to SNF for short term rehab. Will follow for any other d/c need. Expected Discharge Date:  06/10/15               Expected Discharge Plan:  Short term SNF for rehab  In-House Referral:  Clinical Social Work  Discharge planning Services  CM Consult  Post Acute Care Choice:    Choice offered to:     DME Arranged:    DME Agency:     HH Arranged:    [place] Agency:     Status of Service:  In process, will continue to follow  Medicare Important Message Given:  Yes Date Medicare IM Given:  06/10/15 Medicare IM give by:  Jasmine Pang RN MPH, case manager, [phone] Date Additional Medicare IM Given:    Additional Medicare Important Message give by:     If discussed at [place] of Stay Meetings, dates discussed:    Additional Comments:  Adron Bene, RN 06/10/2015, 11:57 AM

## 2015-06-10 NOTE — Progress Notes (Signed)
[facility] PROGRESS NOTE  [name] [name] KCM:[id number] DOB: 1946-11-11 DOA: 06/07/2015 PCP: Aura Dials, MD  Assessment/Plan:  Principal Problem:   Acute renal failure: discussed with Dr. Lynda Rainwater. Creatinine ranges 4-5. Renal US without obstruction. Improving with hydration. Saline locked. No need for dialysis at this time. Active Problems: Encephalopathy: MRI negative for anything acute. Seems improved on lower Haldol dose.   Hyperkalemia corrected   Fever none further   Hypoglycemia none further. Blood glucose is controlled.   HTN (hypertension) controlled   Schizophrenia stable:  When asked, patient reports that he hears voices chronically but denies suicidal or homicidal ideation. May need titration of his Haldol if decompensates. Weakness: w/u neg thus far. Stable for skilled nursing facility. Left message with Child psychotherapist.   HPI/Subjective: No complaints. See above.  Objective: Filed Vitals:   06/10/15 0902  BP: 179/75  Pulse: 89  Temp: 99.1 F (37.3 C)  Resp: 18    Intake/Output Summary (Last 24 hours) at 06/10/15 1059 Last data filed at 06/09/15 1807  Gross per 24 hour  Intake    462 ml  Output    300 ml  Net    162 ml   Filed Weights   06/07/15 2015  Weight: 83.9 kg (184 lb 15.5 oz)    Exam:   General:  Asleep. Arousable. More interactive and less somnolent today. Speech less difficult to understand.  Cardiovascular RRR without MGR  Respiratory: CTA without WRR  Abdomen: S. Nt, nd  Ext: no CCE  Basic Metabolic Panel:  Recent Labs Lab 06/07/15 1510 06/07/15 1512 06/07/15 2030 06/08/15 0610 06/09/15 0524 06/10/15 0617  NA 138  --  142 142 146* 141  K 6.3*  --  4.4 4.4 4.4 4.2  CL 109  --  112* 113* 116* 114*  CO2 18*  --  20* 22 24 19*  GLUCOSE 124*  --  133* 114* 150* 132*  BUN 48*  --  43* 39* 36* 29*  CREATININE 4.74* 4.75* 4.42* 4.20* 3.94* 3.75*  CALCIUM 8.6*  --  8.5* 8.3* 8.3* 8.0*  PHOS  --   --   --   --   3.2  --    Liver Function Tests:  Recent Labs Lab 06/09/15 0524  ALBUMIN 2.7*   No results for input(s): LIPASE, AMYLASE in the last 168 hours.  Recent Labs Lab 06/10/15 0617  AMMONIA 21   CBC:  Recent Labs Lab 06/07/15 1510 06/07/15 1512 06/08/15 0610 06/10/15 0617  WBC 7.4 7.5 6.2 6.0  HGB 10.8* 10.7* 9.4* 9.1*  HCT 32.8* 32.3* 28.8* 27.7*  MCV 85.2 85.7 86.2 87.9  PLT 263 255 214 180   Cardiac Enzymes:  Recent Labs Lab 06/08/15 1710  CKTOTAL 156   BNP (last 3 results) No results for input(s): BNP in the last 8760 hours.  ProBNP (last 3 results) No results for input(s): PROBNP in the last 8760 hours.  CBG:  Recent Labs Lab 06/09/15 1629 06/09/15 2022 06/10/15 0032 06/10/15 0434 06/10/15 0726  GLUCAP 188* 156* 134* 117* 143*    Recent Results (from the past 240 hour(s))  Urine culture     Status: None   Collection Time: 06/07/15  6:22 PM  Result Value Ref Range Status   Specimen Description URINE, CLEAN CATCH  Final   Special Requests Normal  Final   Culture   Final    MULTIPLE SPECIES PRESENT, SUGGEST RECOLLECTION IF CLINICALLY INDICATED   Report Status 06/09/2015 FINAL  Final  Studies: Mr Brain Wo Contrast  06/09/2015   CLINICAL DATA:  Dysarthria, gait disturbance. Found down, weakness. History of diabetes, hypertension, chronic kidney disease, hyperlipidemia, schizophrenia.  EXAM: MRI HEAD WITHOUT CONTRAST  TECHNIQUE: Multiplanar, multiecho pulse sequences of the brain and surrounding structures were obtained without intravenous contrast.  COMPARISON:  CT head June 07, 2015  FINDINGS: Mild ventriculomegaly, consistent with global parenchymal brain volume loss as there is overall commensurate enlargement of cerebral sulci and cerebellar folia, with mild ex vacuo dilatation of frontal horn of the LEFT lateral ventricle, associated with underlying at LEFT basal ganglia cystic lacunar infarcts for risk for present previously. Bilateral inferior  basal ganglia perivascular spaces. Patchy white matter T2 hyperintensities, some of which appear to radiate from the periventricular margin. No midline shift, mass effect or mass lesions.  No abnormal extra-axial fluid collections. Normal major intracranial vascular flow voids seen at the skull base. Mild ethmoid mucosal thickening without paranasal sinus air-fluid levels. The mastoid air cells are well aerated. Bilateral ocular lens implants. No abnormal sellar expansion. No cerebellar tonsillar ectopia. No suspicious calvarial bone marrow signal.  IMPRESSION: No acute intracranial process, specifically no acute ischemia.  Old LEFT basal ganglia lacunar infarct. Moderate white matter changes, these can be seen with chronic small vessel ischemic disease though, there could be a component of chronic demyelination.   Electronically Signed   By: Awilda Metro M.D.   On: 06/09/2015 23:43   US Renal  06/09/2015   CLINICAL DATA:  Acute renal failure  EXAM: RENAL / URINARY TRACT ULTRASOUND COMPLETE  COMPARISON:  06/11/2014  FINDINGS: Right Kidney:  Length: 11 cm. Echogenicity within normal limits. No mass or hydronephrosis visualized.  Left Kidney:  Length: 10.8 cm. Echogenicity within normal limits. No mass or hydronephrosis visualized.  Bladder:  Appears normal for degree of bladder distention.  IMPRESSION: 1. Normal renal sonogram.   Electronically Signed   By: Signa Kell M.D.   On: 06/09/2015 11:00    Scheduled Meds: . amLODipine  10 mg Oral Daily  . aspirin  81 mg Oral Daily  . benztropine  1 mg Oral BID  . cholecalciferol  1,000 Units Oral Daily  . ciprofloxacin  2 drop Right Eye Q4H while awake  . haloperidol  1 mg Oral QHS  . heparin  5,000 Units Subcutaneous 3 times per day  . hydrALAZINE  25 mg Oral TID  . insulin aspart  0-9 Units Subcutaneous TID WC  . metoprolol  100 mg Oral BID  . pantoprazole  40 mg Oral Daily  . pravastatin  40 mg Oral QPM  . sodium chloride  3 mL Intravenous Q12H    Continuous Infusions:    Time spent: 25 minutes  [name]  [facility] [email or web address], password [facility] 06/10/2015, 10:59 AM  LOS: 3 days

## 2015-06-10 NOTE — Clinical Social Work Note (Signed)
CSW met with patient and his son Clifton James to present bed offers for SNF placement.  Patient's son stated he has never been to SNF rehab, CSW explained what to expect and how the goal is to return back home.  Patient receives help from his ex-wife and his son to make decisions per his request.  Patient's son is going to discuss with patient's ex-wife [name] the bed offers, and will contact CSW tomorrow morning.  CSW informed patient and his son, that once the physician says patient is medically ready to discharge to a lower level of care, then it is the job of the CSW to help facilitate discharge planning to SNFs.  Jones Broom. [place], MSW, [place] 06/10/2015 5:00 PM

## 2015-06-11 DIAGNOSIS — E162 Hypoglycemia, unspecified: Secondary | ICD-10-CM

## 2015-06-11 LAB — BASIC METABOLIC PANEL
Anion gap: 7 (ref 5–15)
BUN: 33 mg/dL — ABNORMAL HIGH (ref 6–20)
CALCIUM: 8 mg/dL — AB (ref 8.9–10.3)
CHLORIDE: 112 mmol/L — AB (ref 101–111)
CO2: 21 mmol/L — AB (ref 22–32)
Creatinine, Ser: 3.83 mg/dL — ABNORMAL HIGH (ref 0.61–1.24)
GFR calc Af Amer: 17 mL/min — ABNORMAL LOW (ref >60)
GFR calc non Af Amer: 15 mL/min — ABNORMAL LOW (ref >60)
Glucose, Bld: 153 mg/dL — ABNORMAL HIGH (ref 65–99)
Potassium: 3.9 mmol/L (ref 3.5–5.1)
Sodium: 140 mmol/L (ref 135–145)

## 2015-06-11 LAB — GLUCOSE, CAPILLARY
GLUCOSE-CAPILLARY: 146 mg/dL — AB (ref 65–99)
GLUCOSE-CAPILLARY: 179 mg/dL — AB (ref 65–99)

## 2015-06-11 MED ORDER — HALOPERIDOL 2 MG PO TABS: 2.0000 mg | ORAL_TABLET | Freq: Two times a day (BID) | ORAL | Status: DC

## 2015-06-11 MED ORDER — INSULIN DETEMIR 100 UNIT/ML ~~LOC~~ SOLN: 5.0000 [IU] | Freq: Every day | SUBCUTANEOUS | Status: DC

## 2015-06-11 MED ORDER — HALOPERIDOL 2 MG PO TABS
2.0000 mg | ORAL_TABLET | Freq: Two times a day (BID) | ORAL | Status: DC
  Administered 2015-06-11: 2 mg via ORAL
  Filled 2015-06-11 (×2): qty 1

## 2015-06-11 MED ORDER — DARBEPOETIN ALFA 40 MCG/0.4ML IJ SOSY: 40.0000 ug | PREFILLED_SYRINGE | INTRAMUSCULAR | Status: DC

## 2015-06-11 MED ORDER — HYDRALAZINE HCL 25 MG PO TABS
25.0000 mg | ORAL_TABLET | Freq: Three times a day (TID) | ORAL | Status: DC
Start: 2015-06-11 — End: 2017-10-21

## 2015-06-11 NOTE — Progress Notes (Signed)
SW called,patient would be going to [facility] ex -wife and his son would bringing him to that facility.

## 2015-06-11 NOTE — Discharge Summary (Addendum)
Physician Discharge Summary  SAKETH DAUBERT VOZ:[id number] DOB: 15-Jan-1946 DOA: 06/07/2015  PCP: Aura Dials, MD  Admit date: 06/07/2015 Discharge date: 06/11/2015  Time spent: greater than 30 minutes  Recommendations for Outpatient Follow-up:  1. To SNF 2. Monitor basic metabolic panel periodically. 3. Monitor blood glucose at least daily.  Discharge Diagnoses:  Principal Problem:   Acute renal failure in the setting of worsening chronic kidney disease, stage IV Active Problems:   Hyperkalemia   Fever   Hypoglycemia   HTN (hypertension), uncontrolled   Schizophrenia   Acute encephalopathy, suspect multifactorial related to hypoglycemia, medications Type 2 diabetes with renal manifestations   Discharge Condition: stable  Diet recommendation: heart healthy carbohydrate modified  Filed Weights   06/07/15 2015 06/10/15 2103 06/11/15 0800  Weight: 83.9 kg (184 lb 15.5 oz) 87.5 kg (192 lb 14.4 oz) 86.7 kg (191 lb 2.2 oz)    History of present illness:  69 y.o. male with a past medical history of diabetes on insulin, schizophrenia, hypertension, chronic kidney disease stage IV, hyperlipidemia. The patient lives alone but his ex-wife goes every day and sets up his night and next morning's medications for him. When she went to check on him this afternoon, he was not opening the door. She was knocking on the window and asking him to open the door. He stated that he was not able to get up. Her son came and was able to get in the house and open the door.They found him laying in the bed appearing quite weak. He subsequently began getting a little confused. EMS arrived and checked his blood sugar and found it to be 37. He was given an amp of D50 and brought into the ER. In the ER he was found to have acute renal failure with hyperkalemia and a low-grade fever. Blood sugars have been stable ever since being given the D50. The patient cannot recall if he took his medications yesterday. He Is  unable to tell me if he's had a poor appetite nausea vomiting or diarrhea. He is noted to have a low-grade fever in the ER but does not admit to any fevers or chills. Does not admit to any cough.  Hospital Course:  Patient was admitted to telemetry to the hospitalist service.   Acute renal failure/progressive chronic kidney disease: discussed with Dr. Kathrene Bongo. Creatinine ranges 4-5. Renal US without obstruction. Improving with hydration to about 3.8.. Saline locked. No need for dialysis at this time, but will need continued follow-up with Dr. Kathrene Bongo. Have stopped hydrochlorothiazide and patient has no edema.   Hyperkalemia corrected with medical therapy and did not return. Will need periodic monitoring.  Type 2 diabetes with Hypoglycemia: Patient had been on 40 units of Lantus as an outpatient. This was stopped. Blood glucose is corrected. He was on sliding scale NovoLog sensitive while here. May resume Lantus at 5 units and monitor blood glucose is. Given worsening chronic kidney disease, needs lower insulin dosing.  Acute Encephalopathy: Likely multifactorial, from acute renal failure, hypoglycemia. However, After hydrated and hypoglycemia corrected, patient remained somewhat lethargic and difficult to understand. Workup, including B-12, folate, TSH, ammonia level, CT brain, MRI showed nothing concerning. Patient had been on 5 mg of Haldol in the morning, 10 mg in the evening. This was felt to be contributing and therefore decreased to 2 mg by mouth twice a day. She is much more alert and I suspect is close to his baseline. He has worked with physical therapy and will benefit from  skilled nursing facility due to debilitation.   Fever none further. No infection found.   HTN (hypertension) uncontrolled during hospitalization. Metoprolol was resumed. Norvasc was resumed. Hydrochlorothiazide stopped due to renal failure. Patient has been started on hydralazine 25 mg by mouth 3 times a  day and this may need to be adjusted if blood pressures remain high.   Schizophrenia stable: Patient displays nor endorses any suicidal or homicidal ideation. He does not appear to be responding to internal stimuli. He seems to be doing well on Haldol 2 mg twice a day, but may need adjustment should his symptoms worsen. Would be very cautious of oversedation.  Procedures:  none  Consultations:  none  Discharge Exam: Filed Vitals:   06/11/15 0800  BP: 164/76  Pulse: 88  Temp: 99.5 F (37.5 C)  Resp: 16    General: Asleep. Arousable. Oriented. Cardiovascular: Regular rate rhythm without murmurs gallops rubs Respiratory: Clear to auscultation bilaterally without wheezes rhonchi or rales Abdomen soft nontender nondistended Extremities no clubbing cyanosis or edema Psychiatric: Patient is calm and cooperative with poor eye contact but otherwise normal affect. Neurologic: Speech clear and fluent. Motor strength nonfocal.  Discharge Instructions   Discharge Instructions    Diet - low sodium heart healthy    Complete by:  As directed      Diet Carb Modified    Complete by:  As directed      Walk with assistance    Complete by:  As directed           Current Discharge Medication List    START taking these medications   Details  Darbepoetin Alfa (ARANESP) 40 MCG/0.4ML SOSY injection Inject 0.4 mLs (40 mcg total) into the skin every Friday at 6 PM. Qty: 8.4 mL    hydrALAZINE (APRESOLINE) 25 MG tablet Take 1 tablet (25 mg total) by mouth 3 (three) times daily.      CONTINUE these medications which have CHANGED   Details  haloperidol (HALDOL) 2 MG tablet Take 1 tablet (2 mg total) by mouth 2 (two) times daily. Take1 tab in am, then take 2 tabs in pm Qty: 60 tablet, Refills: 0    insulin detemir (LEVEMIR) 100 UNIT/ML injection Inject 0.05 mLs (5 Units total) into the skin at bedtime. Qty: 10 mL, Refills: 11      CONTINUE these medications which have NOT CHANGED    Details  amLODipine (NORVASC) 10 MG tablet Take 10 mg by mouth daily.    aspirin 81 MG tablet Take 81 mg by mouth daily.    benztropine (COGENTIN) 1 MG tablet Take 1 mg by mouth 2 (two) times daily.    Cholecalciferol (VITAMIN D-3) 1000 UNITS CAPS Take 1 capsule by mouth daily.    esomeprazole (NEXIUM) 20 MG capsule Take 20 mg by mouth daily as needed (for heartburn).     metoprolol (LOPRESSOR) 100 MG tablet Take 100 mg by mouth 2 (two) times daily.    pravastatin (PRAVACHOL) 40 MG tablet Take 40 mg by mouth every evening.       STOP taking these medications     hydrochlorothiazide (HYDRODIURIL) 12.5 MG tablet        Allergies  Allergen Reactions  . Bee Venom Anaphylaxis   Follow-up Information    Follow up with Cecille Aver, MD.   Specialty:  Nephrology   Why:  as previously scheduled   Contact information:   [address] [place] [address] [phone]  The results of significant diagnostics from this hospitalization (including imaging, microbiology, ancillary and laboratory) are listed below for reference.    Significant Diagnostic Studies: Dg Chest 2 View  06/07/2015   CLINICAL DATA:  Slurred speech, LEFT-sided weakness, drawn lips, last seen normal at 1200 hours yesterday, hypoglycemia, history hypertension, diabetes mellitus, GERD, chronic kidney disease  EXAM: CHEST  2 VIEW  COMPARISON:  04/14/2015  FINDINGS: Minimal enlargement of cardiac silhouette.  Mediastinal contours and pulmonary vascularity normal.  Lungs clear.  No pleural effusion or pneumothorax.  Bones unremarkable.  IMPRESSION: Minimal enlargement of cardiac silhouette without acute infiltrate.   Electronically Signed   By: Ulyses Southward M.D.   On: 06/07/2015 15:43   Ct Head Wo Contrast  06/07/2015   CLINICAL DATA:  Slurred speech and LEFT-sided weakness. Altered mental status. Urinary incontinence. Personal history of chronic kidney disease.  EXAM: CT HEAD WITHOUT CONTRAST  TECHNIQUE:  Contiguous axial images were obtained from the base of the skull through the vertex without intravenous contrast.  COMPARISON:  None.  FINDINGS: No mass lesion, mass effect, midline shift, hydrocephalus, hemorrhage. No acute territorial cortical ischemia/infarct. Atrophy and chronic ischemic white matter disease is present. Chronic appearing lacunar infarcts are present in the LEFT basal ganglia. Small calcification is present in the LEFT middle cerebral peduncle, incidentally noted. Bilateral lens extractions are noted.  The calvarium is intact. Mastoid air cells clear. Intracranial atherosclerosis. Visible paranasal sinuses are within normal limits.  IMPRESSION: No acute intracranial abnormality. 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   Electronically Signed   By: Andreas Newport M.D.   On: 06/07/2015 16:33   Mr Brain Wo Contrast  06/09/2015   CLINICAL DATA:  Dysarthria, gait disturbance. Found down, weakness. History of diabetes, hypertension, chronic kidney disease, hyperlipidemia, schizophrenia.  EXAM: MRI HEAD WITHOUT CONTRAST  TECHNIQUE: Multiplanar, multiecho pulse sequences of the brain and surrounding structures were obtained without intravenous contrast.  COMPARISON:  CT head June 07, 2015  FINDINGS: Mild ventriculomegaly, consistent with global parenchymal brain volume loss as there is overall commensurate enlargement of cerebral sulci and cerebellar folia, with mild ex vacuo dilatation of frontal horn of the LEFT lateral ventricle, associated with underlying at LEFT basal ganglia cystic lacunar infarcts for risk for present previously. Bilateral inferior basal ganglia perivascular spaces. Patchy white matter T2 hyperintensities, some of which appear to radiate from the periventricular margin. No midline shift, mass effect or mass lesions.  No abnormal extra-axial fluid collections. Normal major intracranial vascular flow voids seen at the skull  base. Mild ethmoid mucosal thickening without paranasal sinus air-fluid levels. The mastoid air cells are well aerated. Bilateral ocular lens implants. No abnormal sellar expansion. No cerebellar tonsillar ectopia. No suspicious calvarial bone marrow signal.  IMPRESSION: No acute intracranial process, specifically no acute ischemia.  Old LEFT basal ganglia lacunar infarct. Moderate white matter changes, these can be seen with chronic small vessel ischemic disease though, there could be a component of chronic demyelination.   Electronically Signed   By: Awilda Metro M.D.   On: 06/09/2015 23:43   US Renal  06/09/2015   CLINICAL DATA:  Acute renal failure  EXAM: RENAL / URINARY TRACT ULTRASOUND COMPLETE  COMPARISON:  06/11/2014  FINDINGS: Right Kidney:  Length: 11 cm. Echogenicity within normal limits. No mass or hydronephrosis visualized.  Left Kidney:  Length: 10.8 cm. Echogenicity within normal limits. No mass or hydronephrosis visualized.  Bladder:  Appears normal for degree of bladder  distention.  IMPRESSION: 1. Normal renal sonogram.   Electronically Signed   By: Signa Kell M.D.   On: 06/09/2015 11:00    Microbiology: Recent Results (from the past 240 hour(s))  Urine culture     Status: None   Collection Time: 06/07/15  6:22 PM  Result Value Ref Range Status   Specimen Description URINE, CLEAN CATCH  Final   Special Requests Normal  Final   Culture   Final    MULTIPLE SPECIES PRESENT, SUGGEST RECOLLECTION IF CLINICALLY INDICATED   Report Status 06/09/2015 FINAL  Final     Labs: Basic Metabolic Panel:  Recent Labs Lab 06/07/15 2030 06/08/15 0610 06/09/15 0524 06/10/15 0617 06/11/15 0305  NA 142 142 146* 141 140  K 4.4 4.4 4.4 4.2 3.9  CL 112* 113* 116* 114* 112*  CO2 20* 22 24 19* 21*  GLUCOSE 133* 114* 150* 132* 153*  BUN 43* 39* 36* 29* 33*  CREATININE 4.42* 4.20* 3.94* 3.75* 3.83*  CALCIUM 8.5* 8.3* 8.3* 8.0* 8.0*  PHOS  --   --  3.2  --   --    Liver Function  Tests:  Recent Labs Lab 06/09/15 0524  ALBUMIN 2.7*   No results for input(s): LIPASE, AMYLASE in the last 168 hours.  Recent Labs Lab 06/10/15 0617  AMMONIA 21   CBC:  Recent Labs Lab 06/07/15 1510 06/07/15 1512 06/08/15 0610 06/10/15 0617  WBC 7.4 7.5 6.2 6.0  HGB 10.8* 10.7* 9.4* 9.1*  HCT 32.8* 32.3* 28.8* 27.7*  MCV 85.2 85.7 86.2 87.9  PLT 263 255 214 180   Cardiac Enzymes:  Recent Labs Lab 06/08/15 1710  CKTOTAL 156   BNP: BNP (last 3 results) No results for input(s): BNP in the last 8760 hours.  ProBNP (last 3 results) No results for input(s): PROBNP in the last 8760 hours.  CBG:  Recent Labs Lab 06/10/15 0726 06/10/15 1145 06/10/15 1706 06/10/15 2109 06/11/15 0740  GLUCAP 143* 166* 128* 129* 146*       Signed:  Marveen Donlon L  [facility] 06/11/2015, 10:34 AM

## 2015-06-11 NOTE — Progress Notes (Signed)
Discharged packet given to his ex-wife and son for discharge to SNF.

## 2015-06-11 NOTE — Clinical Social Work Placement (Addendum)
   CLINICAL SOCIAL WORK PLACEMENT  NOTE  Date:  06/11/2015  Patient Details  Name: [name] [name] MRN: [id number] Date of Birth: Jun 08, 1946  Clinical Social Work is seeking post-discharge placement for this patient at the Skilled  Nursing Facility level of care (*CSW will initial, date and re-position this form in  chart as items are completed):  Yes   Patient/family provided with [place] Clinical Social Work Department's list of facilities offering this level of care within the geographic area requested by the patient (or if unable, by the patient's family).  Yes   Patient/family informed of their freedom to choose among providers that offer the needed level of care, that participate in Medicare, Medicaid or managed care program needed by the patient, have an available bed and are willing to accept the patient.  Yes   Patient/family informed of [facility]'s ownership interest in [facility] and [facility], as well as of the fact that they are under no obligation to receive care at these facilities.  PASRR submitted to EDS on 06/10/15     PASRR number received on       Existing PASRR number confirmed on 06/10/15     FL2 transmitted to all facilities in geographic area requested by pt/family on 06/10/15     FL2 transmitted to all facilities within larger geographic area on       Patient informed that his/her managed care company has contracts with or will negotiate with certain facilities, including the following:   [facility])     Yes   Patient/family informed of bed offers received.  Patient chooses bed at [facility]     Physician recommends and patient chooses bed at      Patient to be transferred to [facility] on 06/11/15.  Patient to be transferred to facility by [facility] with ex-wife  Dayna Ramus and sonMikle Bosworth     Patient family notified on 06/04/15 of transfer.  Name of family member notified:  Robie Ridge- Son and   patient's ex-wife [name]      PHYSICIAN Please sign FL2, Please sign DNR, Please prepare priority discharge summary, including medications, Please prepare prescriptions     Additional Comment: DC today to SNF for short term rehab. Patient was agreeable to d/c and stated he was glad to be leaving the hospital for rehab.  Son called to request a private room if possible at [facility]; CSW spoke to Clydie Braun- Admissions at [facility]. She stated that they currently do not have a private room but can move him when one comes available.  Son verbalized that pleasure with this.  Nursing notified to call report.  CSW signing off.  Lorri Frederick. Andria Rhein [phone]      _______________________________________________ Lovette Cliche T, LCSW 06/11/2015, 5:30 pm

## 2015-07-12 ENCOUNTER — Other Ambulatory Visit: Payer: Self-pay | Admitting: *Deleted

## 2015-07-12 DIAGNOSIS — N184 Chronic kidney disease, stage 4 (severe): Secondary | ICD-10-CM

## 2015-07-12 DIAGNOSIS — Z01810 Encounter for preprocedural cardiovascular examination: Secondary | ICD-10-CM

## 2015-08-12 ENCOUNTER — Other Ambulatory Visit (HOSPITAL_COMMUNITY): Payer: Medicare Other

## 2015-08-12 ENCOUNTER — Encounter (HOSPITAL_COMMUNITY): Payer: Medicare Other

## 2015-08-12 ENCOUNTER — Ambulatory Visit: Payer: Medicare Other | Admitting: Vascular Surgery

## 2015-08-18 ENCOUNTER — Other Ambulatory Visit: Payer: Self-pay | Admitting: Nurse Practitioner

## 2015-08-30 ENCOUNTER — Other Ambulatory Visit (HOSPITAL_COMMUNITY): Payer: Self-pay | Admitting: Nurse Practitioner

## 2015-08-30 DIAGNOSIS — R1310 Dysphagia, unspecified: Secondary | ICD-10-CM

## 2015-09-02 ENCOUNTER — Ambulatory Visit (HOSPITAL_COMMUNITY)
Admission: RE | Admit: 2015-09-02 | Discharge: 2015-09-02 | Disposition: A | Discharge status: Home / Self Care | Payer: Medicare Other | Source: Ambulatory Visit | Attending: Nurse Practitioner | Admitting: Nurse Practitioner

## 2015-09-02 ENCOUNTER — Other Ambulatory Visit (HOSPITAL_COMMUNITY): Payer: Self-pay | Admitting: Nurse Practitioner

## 2015-09-02 DIAGNOSIS — E785 Hyperlipidemia, unspecified: Secondary | ICD-10-CM | POA: Diagnosis not present

## 2015-09-02 DIAGNOSIS — R1310 Dysphagia, unspecified: Secondary | ICD-10-CM

## 2015-09-02 DIAGNOSIS — R1313 Dysphagia, pharyngeal phase: Secondary | ICD-10-CM | POA: Insufficient documentation

## 2015-09-02 DIAGNOSIS — E119 Type 2 diabetes mellitus without complications: Secondary | ICD-10-CM | POA: Diagnosis not present

## 2015-09-02 DIAGNOSIS — K219 Gastro-esophageal reflux disease without esophagitis: Secondary | ICD-10-CM | POA: Diagnosis not present

## 2015-09-02 DIAGNOSIS — I129 Hypertensive chronic kidney disease with stage 1 through stage 4 chronic kidney disease, or unspecified chronic kidney disease: Secondary | ICD-10-CM | POA: Insufficient documentation

## 2015-09-02 DIAGNOSIS — Z87442 Personal history of urinary calculi: Secondary | ICD-10-CM | POA: Diagnosis not present

## 2015-09-02 NOTE — Procedures (Signed)
Objective Swallowing Evaluation: Other (Comment)  Patient Details  Name: [name] [name] MRN: [id number] Date of Birth: 08-18-1946  Today's Date: 09/02/2015 Time: SLP Start Time (ACUTE ONLY): 1045-SLP Stop Time (ACUTE ONLY): 1115 SLP Time Calculation (min) (ACUTE ONLY): 30 min  Past Medical History:  Past Medical History  Diagnosis Date  . Diabetes mellitus without complication   . Hypertension   . GERD (gastroesophageal reflux disease)   . Hyperlipidemia   . Chronic kidney disease     kidney stones   Past Surgical History:  Past Surgical History  Procedure Laterality Date  . No past surgeries    . Cystoscopy with retrograde pyelogram, ureteroscopy and stent placement Left 04/05/2014    Procedure: CYSTOSCOPY WITH RETROGRADE PYELOGRAM, POSSIBLE LEFT URETEROSCOPY AND LEFT STENT PLACEMENT;  Surgeon: Bjorn Pippin, MD;  Location: [facility];  Service: Urology;  Laterality: Left;  . Holmium laser application Left 04/05/2014    Procedure: HOLMIUM LASER APPLICATION;  Surgeon: Bjorn Pippin, MD;  Location: [facility];  Service: Urology;  Laterality: Left;  . Lithotripsy    . Kidney surgery     HPI:  Other Pertinent Information: Pt referred for MBS by Dr Yetta Barre due to dysphagia.  Pt in hospital over the summer with hypoglycemia and encephalopathy.  Pt PMH + Hyperkalemia, schizophrenia, HTN, renal disease - per ex-wife, pt does not desire dialysis.  Exwife reports pt's dysphagia started in July 2016 and primarily presents as problems swallowing pills.  She states dysphagia has not worsened nor improved.  Pt states he has a hard time with food and pills sticking in mouth and he uses liquids to help clear.  Pt takes pills crushed with applesauce, whole pills he allows to dissolve in his mouth.  Weight loss reported - even prior to summer hospital admit, but ex-wife reports has worsening in the last few months.  She attributes weight loss to poor appetite due to renal issues.  Pt has not had pneumonias nor  requiried heimlich manuever.   No Data Recorded  Assessment / Plan / Recommendation CHL IP CLINICAL IMPRESSIONS 09/02/2015  Therapy Diagnosis Moderate oral phase dysphagia;Severe oral phase dysphagia;Mild pharyngeal phase dysphagia  Clinical Impression  Moderately severe oral and mild pharyngeal dysphagia characterized by motor deficits.  Delayed in oral transiting with decreased bolus cohesion noted due to weakness/discoordinationlingual pumping/premature spillage.  Pt with significant lingual pumping/rocking, ? source, ? side effect of medications.  Liquids via straw sequentially swallowed were easier for pt to transit until swallowing last bit left in mouth. Pt required use of thin to aid oral clearance of pudding.  Piecemealing noted across consistencies.    Pt also masticated barium tablet given with pudding.  His wife reports he allows pills that can not be crushed to "dissolve" in his mouth - SLP advised this is essentially crushing them.  Further advised to speak to MD, pharmacist  re: medications Fortunately, pharyngeal swallow mostly intact with minimal delay in intitiation.  Trace laryngeal penetration of thin cleared with further swallows.    Recommend follow up to SLP for trial dysphagia treatment to improve oral coordination.  Advised pt to swish and expectorate with water after meals.  Using live monitor, educated pt to findings and educated ex=wife and pt afterward providing written information.     Thanks for this consult.       CHL IP TREATMENT RECOMMENDATION 09/02/2015  Treatment Recommendations F/u HH SLP     CHL IP DIET RECOMMENDATION 09/02/2015  SLP Diet Recommendations Age appropriate regular  solids;Thin, extra gravy and sauce  Liquid Administration via Cup straw  Medication Administration Crushed with puree, consider alternative for medication that is contraindicated to crush  Compensations Other (Comment);Small sips/bites;Check for pocketing;Follow solids with  liquid;Clear throat intermittently; use liquids to aid oral transiting, rinse and expectorate with water after meals  Postural Changes and/or Swallow Maneuvers Upright after meals     CHL IP OTHER RECOMMENDATIONS 09/02/2015  Recommended Consults HH SlP, dietician  Oral Care Recommendations Oral care before and after PO             CHL IP REASON FOR REFERRAL 09/02/2015  Reason for Referral Objectively evaluate swallowing function     CHL IP ORAL PHASE 09/02/2015, impaired  Delayed transiting     CHL IP PHARYNGEAL PHASE 09/02/2015  Pharyngeal Phase Impaired  Pharyngeal Comment minimal delay in swallow initiation in pharynx      CHL IP CERVICAL ESOPHAGEAL PHASE 09/02/2015  Cervical Esophageal Phase Impaired  Cervical Esophageal Comment appearance of slow clearance at distal esophagus, water appeared to faciliate clearance    CHL IP GO 09/02/2015  Functional Assessment Tool Used MBS, clinical judgement  Functional Limitations Swallowing  Swallow Current Status (U9811) CJ  Swallow Goal Status (B1478) CJ  Swallow Discharge Status [phone]) CJ  Donavan Burnet, MS [facility] SLP [phone]         Donavan Burnet, MS [facility] SLP [phone]

## 2016-01-04 ENCOUNTER — Ambulatory Visit (INDEPENDENT_AMBULATORY_CARE_PROVIDER_SITE_OTHER): Payer: 59 | Admitting: Psychiatry

## 2016-01-04 ENCOUNTER — Encounter (INDEPENDENT_AMBULATORY_CARE_PROVIDER_SITE_OTHER): Payer: Self-pay

## 2016-01-04 ENCOUNTER — Encounter (HOSPITAL_COMMUNITY): Payer: Self-pay | Admitting: Psychiatry

## 2016-01-04 VITALS — BP 132/72 | HR 80 | Ht 70.5 in | Wt 167.8 lb

## 2016-01-04 DIAGNOSIS — F200 Paranoid schizophrenia: Secondary | ICD-10-CM

## 2016-01-04 MED ORDER — HALOPERIDOL 5 MG PO TABS: ORAL_TABLET | ORAL | Status: AC

## 2016-01-04 MED ORDER — BENZTROPINE MESYLATE 1 MG PO TABS: 1.0000 mg | ORAL_TABLET | Freq: Two times a day (BID) | ORAL | Status: AC

## 2016-01-04 NOTE — Progress Notes (Signed)
Psychiatric Initial Adult Assessment   Patient Identification: [name] [name] MRN:  [id number] Date of Evaluation:  01/04/2016 Referral Source: Dr. Zoe [name] Chief Complaint:   sleep all the time Visit Diagnosis: Schizophrenia Diagnosis:  Schizophrenia This patient is a 70 year old divorced father whose experienced a chronic severe mental illness diagnosed with schizophrenia for decades. He's been seen at [facility]. He seen today with his power of attorney and his ex-wife were the same person with the name [name] [name]. The patient is here because the doctor at [facility] has left and they're tired of waiting for the next provider to care for him. He spoke in their primary care doctor who referred him to [place]. The patient actually is doing quite well. He lives a very sedentary lifestyle. He actually lives with his sister [name] [name]. The patient has 3 adult children. 3 sons are actually are involved in his care. His ex-wife who is his power of attorney is here today postop his medicine every night. He clearly is a family invested in taking care of this patient. The patient is described as someone who sleeps all the time. He is very sedate. In 1984 decided to stop taking his medicine and became floridly psychotic aggressive agitated and her auditory hallucinations in a very intense way. Once back on his medicine he was much improved and as stated on the medicine since then. She still describes hearing to women talking they are not commanding or demanding him. He mainly here to 2 women seemingly talk to each other. He sees no visions. He denies being paranoid. The patient denies daily depression. He sleeping excessively and has a low energy level. He eats well. He himself denies being bored or lonely. He obviously is psychomotor slow. Is not suicidal now and never has been. The patient denies the use of alcohol or drugs ever. The patient denies symptoms of mania. The  patient is unable to give a clear description of ever having an episode of major depression. He denies symptoms of generalized anxiety disorder, panic disorder or OCD. The patient has a significant medical problems. He apparently has had multiple TIAs. He has diabetes cholesterol and hypertension. He's on multiple medical medications but psychiatrically he takes only Haldol 5 mg in the morning 10 mg at night and Cogentin 1 mg twice a day. 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 The patient does not smoke cigarettes. The patient shows no evidence of tardive dyskinesia. He has no resting tremor. Patient Active Problem List   Diagnosis Date Noted  . Acute encephalopathy [G93.40] 06/09/2015  . Hyperkalemia [E87.5] 06/07/2015  . Fever [R50.9] 06/07/2015  . Hypoglycemia [E16.2] 06/07/2015  . Acute renal failure (HCC) [N17.9] 06/07/2015  . HTN (hypertension) [I10] 06/07/2015  . Schizophrenia (HCC) [F20.9] 06/07/2015  . Symptomatic cholelithiasis [K80.20] 06/09/2014  . Ureteral stone with hydronephrosis [N13.2] 04/05/2014  . Acute renal insufficiency [N28.9] 04/05/2014  . Chronic renal insufficiency, stage II (mild) [N18.9] 04/05/2014  . Diabetes (HCC) [E11.9] 11/15/2013  . Unspecified essential hypertension [I10] 11/15/2013  . Other and unspecified hyperlipidemia [E78.5] 11/15/2013   History of Present Illness:   Elements:   Associated Signs/Symptoms: Depression Symptoms:   (Hypo) Manic Symptoms:   Anxiety Symptoms:   Psychotic Symptoms:  Hallucinations: Auditory PTSD Symptoms: Negative  Past Medical History:  Past Medical History  Diagnosis Date  . Diabetes mellitus without complication (HCC)   .  Hypertension   . GERD (gastroesophageal reflux disease)   . Hyperlipidemia   . Chronic kidney disease     kidney stones    Past Surgical History  Procedure Laterality Date  . No past surgeries    . Cystoscopy with retrograde pyelogram,  ureteroscopy and stent placement Left 04/05/2014    Procedure: CYSTOSCOPY WITH RETROGRADE PYELOGRAM, POSSIBLE LEFT URETEROSCOPY AND LEFT STENT PLACEMENT;  Surgeon: Bjorn Pippin, MD;  Location: [facility];  Service: Urology;  Laterality: Left;  . Holmium laser application Left 04/05/2014    Procedure: HOLMIUM LASER APPLICATION;  Surgeon: Bjorn Pippin, MD;  Location: [facility];  Service: Urology;  Laterality: Left;  . Lithotripsy    . Kidney surgery     Family History:  Family History  Problem Relation Age of Onset  . Stroke Mother   . Heart disease Father    Social History:   Social History   Social History  . Marital Status: Divorced    Spouse Name: N/A  . Number of Children: N/A  . Years of Education: N/A   Social History Main Topics  . Smoking status: Never Smoker   . Smokeless tobacco: Never Used  . Alcohol Use: No  . Drug Use: No  . Sexual Activity: Not Asked   Other Topics Concern  . None   Social History Narrative   Additional Social History:   Musculoskeletal: Strength & Muscle Tone: cogwheel Gait & Station: unsteady Patient leans: Right  Psychiatric Specialty Exam: HPI  ROS  Blood pressure 132/72, pulse 80, height 5' 10.5" (1.791 m), weight 167 lb 12.8 oz (76.114 kg).Body mass index is 23.73 kg/(m^2).  General Appearance: Disheveled  Eye Contact:  Poor  Speech:  Garbled  Volume:  Decreased  Mood:  Euthymic  Affect:  Blunt  Thought Process:  Coherent  Orientation:  Full (Time, Place, and Person)  Thought Content:  WDL  Suicidal Thoughts:  No  Homicidal Thoughts:  No  Memory:  NA  Judgement:  Fair  Insight:  Fair  Psychomotor Activity:  Decreased  Concentration:  Poor  Recall:  Poor  Fund of Knowledge:Poor  Language: Poor  Akathisia:  No  Handed:  Right  AIMS (if indicated):     Assets:  Financial Resources/Insurance  ADL's:  Impaired  Cognition: WNL  Sleep:  Excessive    Is the patient at risk to self?  No. Has the patient been a risk to self in the  past 6 months?  No. Has the patient been a risk to self within the distant past?  No. Is the patient a risk to others?  No. Has the patient been a risk to others in the past 6 months?  No. Has the patient been a risk to others within the distant past?  No.  Allergies:   Allergies  Allergen Reactions  . Bee Venom Anaphylaxis   Current Medications: Current Outpatient Prescriptions  Medication Sig Dispense Refill  . amLODipine (NORVASC) 10 MG tablet Take 10 mg by mouth daily.    Marland Kitchen aspirin 81 MG tablet Take 81 mg by mouth daily.    . benztropine (COGENTIN) 1 MG tablet Take 1 tablet (1 mg total) by mouth 2 (two) times daily. 60 tablet 5  . Cholecalciferol (VITAMIN D-3) 1000 UNITS CAPS Take 1 capsule by mouth daily.    . Darbepoetin Alfa (ARANESP) 40 MCG/0.4ML SOSY injection Inject 0.4 mLs (40 mcg total) into the skin every Friday at 6 PM. 8.4 mL   . esomeprazole (NEXIUM) 20  MG capsule Take 20 mg by mouth daily as needed (for heartburn).     . haloperidol (HALDOL) 5 MG tablet Take1 tab in am, then take 2 tabs in pm 90 tablet 5  . hydrALAZINE (APRESOLINE) 25 MG tablet Take 1 tablet (25 mg total) by mouth 3 (three) times daily.    . insulin detemir (LEVEMIR) 100 UNIT/ML injection Inject 0.05 mLs (5 Units total) into the skin at bedtime. 10 mL 11  . metoprolol (LOPRESSOR) 100 MG tablet Take 100 mg by mouth 2 (two) times daily.    . pravastatin (PRAVACHOL) 40 MG tablet Take 40 mg by mouth every evening.      No current facility-administered medications for this visit.    Previous Psychotropic Medications: Yes   Substance Abuse History in the last 12 months:  No.  Consequences of Substance Abuse: Negative  Medical Decision Making:  Self-Limited or Minor (1)  Treatment Plan Summary: At this time this patient's problem is his chronic severe mental illness. Efforts in the past to reduce his Haldol or to discontinue it has led to collapse according to his power of attorney/ex-wife. Today  we'll go slow. At this time we'll continue all his medications including Haldol 5 mg one in the morning and 2 at night and Cogentin 1 mg twice a day. I do not think this patient is been on any new or antipsychotic medications but it hesitant to try. He has significant medical problems and noted also that he has stage V renal failure. The family is done well working around him caring for him. The patient denies any distress. Even the voices he is hearing do not bother him. Generally I hesitant to make any changes. The only possible change to consider is to reduce his Cogentin 2.5 mg twice a day which I manage on her next visit. With this high of a dose of Haldol it's unlikely he needs much Cogentin or any all. Given his age and given his cognitive limitations I think reducing any acetylcholine medications would be a good idea. This patient to return to see me in 3 months. The possibility of sending his family to [facility] should be considered. Some other type of daycare center might be considered but this needs to be strongly evaluated. The bottom line is patient has a history of being very psychotic and inhale and at least for the last 2 decades has done very well. He is minimal problems from her from his Haldol.     Brealynn Contino IRVING 1/18/20171:56 PM

## 2016-02-20 ENCOUNTER — Encounter (HOSPITAL_COMMUNITY): Payer: Self-pay | Admitting: *Deleted

## 2016-02-20 DIAGNOSIS — R740 Nonspecific elevation of levels of transaminase and lactic acid dehydrogenase [LDH]: Secondary | ICD-10-CM | POA: Diagnosis not present

## 2016-02-20 DIAGNOSIS — K8020 Calculus of gallbladder without cholecystitis without obstruction: Secondary | ICD-10-CM | POA: Diagnosis not present

## 2016-02-20 DIAGNOSIS — E785 Hyperlipidemia, unspecified: Secondary | ICD-10-CM | POA: Insufficient documentation

## 2016-02-20 DIAGNOSIS — E875 Hyperkalemia: Secondary | ICD-10-CM | POA: Diagnosis not present

## 2016-02-20 DIAGNOSIS — Z66 Do not resuscitate: Secondary | ICD-10-CM | POA: Insufficient documentation

## 2016-02-20 DIAGNOSIS — D631 Anemia in chronic kidney disease: Secondary | ICD-10-CM | POA: Diagnosis not present

## 2016-02-20 DIAGNOSIS — Z794 Long term (current) use of insulin: Secondary | ICD-10-CM | POA: Diagnosis not present

## 2016-02-20 DIAGNOSIS — I120 Hypertensive chronic kidney disease with stage 5 chronic kidney disease or end stage renal disease: Secondary | ICD-10-CM | POA: Insufficient documentation

## 2016-02-20 DIAGNOSIS — Z79899 Other long term (current) drug therapy: Secondary | ICD-10-CM | POA: Insufficient documentation

## 2016-02-20 DIAGNOSIS — K219 Gastro-esophageal reflux disease without esophagitis: Secondary | ICD-10-CM | POA: Insufficient documentation

## 2016-02-20 DIAGNOSIS — F209 Schizophrenia, unspecified: Secondary | ICD-10-CM | POA: Diagnosis not present

## 2016-02-20 DIAGNOSIS — E872 Acidosis: Secondary | ICD-10-CM | POA: Insufficient documentation

## 2016-02-20 DIAGNOSIS — E1122 Type 2 diabetes mellitus with diabetic chronic kidney disease: Secondary | ICD-10-CM | POA: Diagnosis not present

## 2016-02-20 DIAGNOSIS — N179 Acute kidney failure, unspecified: Secondary | ICD-10-CM | POA: Insufficient documentation

## 2016-02-20 DIAGNOSIS — Z8673 Personal history of transient ischemic attack (TIA), and cerebral infarction without residual deficits: Secondary | ICD-10-CM | POA: Insufficient documentation

## 2016-02-20 DIAGNOSIS — R1010 Upper abdominal pain, unspecified: Secondary | ICD-10-CM | POA: Diagnosis present

## 2016-02-20 DIAGNOSIS — N185 Chronic kidney disease, stage 5: Secondary | ICD-10-CM | POA: Diagnosis not present

## 2016-02-20 DIAGNOSIS — Z7982 Long term (current) use of aspirin: Secondary | ICD-10-CM | POA: Insufficient documentation

## 2016-02-20 DIAGNOSIS — R1011 Right upper quadrant pain: Secondary | ICD-10-CM | POA: Diagnosis not present

## 2016-02-20 LAB — CBC
HEMATOCRIT: 27.9 % — AB (ref 39.0–52.0)
HEMOGLOBIN: 9.4 g/dL — AB (ref 13.0–17.0)
MCH: 29.4 pg (ref 26.0–34.0)
MCHC: 33.7 g/dL (ref 30.0–36.0)
MCV: 87.2 fL (ref 78.0–100.0)
Platelets: 166 10*3/uL (ref 150–400)
RBC: 3.2 MIL/uL — AB (ref 4.22–5.81)
RDW: 12.4 % (ref 11.5–15.5)
WBC: 8.1 10*3/uL (ref 4.0–10.5)

## 2016-02-20 LAB — COMPREHENSIVE METABOLIC PANEL
ALT: 74 U/L — ABNORMAL HIGH (ref 17–63)
ANION GAP: 13 (ref 5–15)
AST: 234 U/L — AB (ref 15–41)
Albumin: 3.4 g/dL — ABNORMAL LOW (ref 3.5–5.0)
Alkaline Phosphatase: 87 U/L (ref 38–126)
BILIRUBIN TOTAL: 0.3 mg/dL (ref 0.3–1.2)
BUN: 95 mg/dL — ABNORMAL HIGH (ref 6–20)
CHLORIDE: 111 mmol/L (ref 101–111)
CO2: 16 mmol/L — ABNORMAL LOW (ref 22–32)
Calcium: 9 mg/dL (ref 8.9–10.3)
Creatinine, Ser: 8.62 mg/dL — ABNORMAL HIGH (ref 0.61–1.24)
GFR, EST AFRICAN AMERICAN: 6 mL/min — AB (ref >60)
GFR, EST NON AFRICAN AMERICAN: 5 mL/min — AB (ref >60)
Glucose, Bld: 194 mg/dL — ABNORMAL HIGH (ref 65–99)
POTASSIUM: 5.3 mmol/L — AB (ref 3.5–5.1)
Sodium: 140 mmol/L (ref 135–145)
TOTAL PROTEIN: 7.2 g/dL (ref 6.5–8.1)

## 2016-02-20 LAB — LIPASE, BLOOD: LIPASE: 27 U/L (ref 11–51)

## 2016-02-20 NOTE — ED Notes (Addendum)
Pt reports right side abd pain since yesterday. Denies n/v/d or urinary symptoms. Per family member, pt is end stage renal failure but refuses dialysis. When asked about pain, pt giving minimal information at triage, they state that pt seems at baseline.

## 2016-02-21 ENCOUNTER — Encounter (HOSPITAL_COMMUNITY): Payer: Self-pay | Admitting: *Deleted

## 2016-02-21 ENCOUNTER — Emergency Department (HOSPITAL_COMMUNITY): Payer: Medicare Other

## 2016-02-21 ENCOUNTER — Observation Stay (HOSPITAL_COMMUNITY): Payer: Medicare Other

## 2016-02-21 ENCOUNTER — Observation Stay (HOSPITAL_COMMUNITY)
Admission: EM | Admit: 2016-02-21 | Discharge: 2016-02-24 | Disposition: A | Discharge status: Home / Self Care | Payer: Medicare Other | Attending: Internal Medicine | Admitting: Internal Medicine

## 2016-02-21 DIAGNOSIS — I10 Essential (primary) hypertension: Secondary | ICD-10-CM | POA: Diagnosis present

## 2016-02-21 DIAGNOSIS — R1084 Generalized abdominal pain: Secondary | ICD-10-CM

## 2016-02-21 DIAGNOSIS — R7401 Elevation of levels of liver transaminase levels: Secondary | ICD-10-CM

## 2016-02-21 DIAGNOSIS — R740 Nonspecific elevation of levels of transaminase and lactic acid dehydrogenase [LDH]: Secondary | ICD-10-CM

## 2016-02-21 DIAGNOSIS — N179 Acute kidney failure, unspecified: Secondary | ICD-10-CM

## 2016-02-21 DIAGNOSIS — F209 Schizophrenia, unspecified: Secondary | ICD-10-CM | POA: Diagnosis present

## 2016-02-21 DIAGNOSIS — E875 Hyperkalemia: Secondary | ICD-10-CM | POA: Diagnosis present

## 2016-02-21 DIAGNOSIS — R109 Unspecified abdominal pain: Secondary | ICD-10-CM | POA: Diagnosis present

## 2016-02-21 DIAGNOSIS — N189 Chronic kidney disease, unspecified: Secondary | ICD-10-CM

## 2016-02-21 DIAGNOSIS — E118 Type 2 diabetes mellitus with unspecified complications: Secondary | ICD-10-CM | POA: Insufficient documentation

## 2016-02-21 DIAGNOSIS — E119 Type 2 diabetes mellitus without complications: Secondary | ICD-10-CM

## 2016-02-21 DIAGNOSIS — D631 Anemia in chronic kidney disease: Secondary | ICD-10-CM

## 2016-02-21 DIAGNOSIS — G8929 Other chronic pain: Secondary | ICD-10-CM

## 2016-02-21 HISTORY — DX: Acute kidney failure, unspecified: N17.9

## 2016-02-21 HISTORY — DX: Acute kidney failure, unspecified: N18.9

## 2016-02-21 LAB — URINE MICROSCOPIC-ADD ON

## 2016-02-21 LAB — GLUCOSE, CAPILLARY
GLUCOSE-CAPILLARY: 69 mg/dL (ref 65–99)
GLUCOSE-CAPILLARY: 93 mg/dL (ref 65–99)
GLUCOSE-CAPILLARY: 165 mg/dL — AB (ref 65–99)
Glucose-Capillary: 170 mg/dL — ABNORMAL HIGH (ref 65–99)

## 2016-02-21 LAB — BASIC METABOLIC PANEL
ANION GAP: 11 (ref 5–15)
BUN: 97 mg/dL — ABNORMAL HIGH (ref 6–20)
CHLORIDE: 112 mmol/L — AB (ref 101–111)
CO2: 17 mmol/L — AB (ref 22–32)
CREATININE: 8.18 mg/dL — AB (ref 0.61–1.24)
Calcium: 9 mg/dL (ref 8.9–10.3)
GFR calc non Af Amer: 6 mL/min — ABNORMAL LOW (ref >60)
GFR, EST AFRICAN AMERICAN: 7 mL/min — AB (ref >60)
Glucose, Bld: 87 mg/dL (ref 65–99)
POTASSIUM: 4.5 mmol/L (ref 3.5–5.1)
SODIUM: 140 mmol/L (ref 135–145)

## 2016-02-21 LAB — URINALYSIS, ROUTINE W REFLEX MICROSCOPIC
Bilirubin Urine: NEGATIVE
GLUCOSE, UA: NEGATIVE mg/dL
Ketones, ur: NEGATIVE mg/dL
Leukocytes, UA: NEGATIVE
Nitrite: NEGATIVE
Protein, ur: 100 mg/dL — AB
SPECIFIC GRAVITY, URINE: 1.013 (ref 1.005–1.030)
pH: 5 (ref 5.0–8.0)

## 2016-02-21 LAB — ETHANOL

## 2016-02-21 MED ORDER — SODIUM CHLORIDE 0.9 % IV BOLUS (SEPSIS)
500.0000 mL | Freq: Once | INTRAVENOUS | Status: AC
  Administered 2016-02-21: 500 mL via INTRAVENOUS

## 2016-02-21 MED ORDER — HEPARIN SODIUM (PORCINE) 5000 UNIT/ML IJ SOLN
5000.0000 [IU] | Freq: Three times a day (TID) | INTRAMUSCULAR | Status: DC
Start: 2016-02-21 — End: 2016-02-24
  Administered 2016-02-21 – 2016-02-24 (×9): 5000 [IU] via SUBCUTANEOUS
  Filled 2016-02-21 (×9): qty 1

## 2016-02-21 MED ORDER — BISACODYL 5 MG PO TBEC: 5.0000 mg | DELAYED_RELEASE_TABLET | Freq: Every day | ORAL | Status: DC | PRN

## 2016-02-21 MED ORDER — INSULIN ASPART 100 UNIT/ML ~~LOC~~ SOLN
5.0000 [IU] | Freq: Once | SUBCUTANEOUS | Status: AC
  Administered 2016-02-21: 5 [IU] via SUBCUTANEOUS
  Filled 2016-02-21: qty 1

## 2016-02-21 MED ORDER — MILK AND MOLASSES ENEMA
1.0000 | Freq: Once | RECTAL | Status: DC
  Filled 2016-02-21: qty 250

## 2016-02-21 MED ORDER — INSULIN ASPART 100 UNIT/ML ~~LOC~~ SOLN
0.0000 [IU] | Freq: Three times a day (TID) | SUBCUTANEOUS | Status: DC
  Administered 2016-02-21: 2 [IU] via SUBCUTANEOUS
  Administered 2016-02-22: 1 [IU] via SUBCUTANEOUS
  Administered 2016-02-22: 2 [IU] via SUBCUTANEOUS
  Administered 2016-02-22 – 2016-02-23 (×2): 3 [IU] via SUBCUTANEOUS
  Administered 2016-02-23: 2 [IU] via SUBCUTANEOUS
  Administered 2016-02-23 – 2016-02-24 (×2): 1 [IU] via SUBCUTANEOUS

## 2016-02-21 MED ORDER — SODIUM CHLORIDE 0.9% FLUSH
3.0000 mL | Freq: Two times a day (BID) | INTRAVENOUS | Status: DC
Start: 2016-02-21 — End: 2016-02-24
  Administered 2016-02-21 – 2016-02-24 (×3): 3 mL via INTRAVENOUS

## 2016-02-21 MED ORDER — DOCUSATE SODIUM 100 MG PO CAPS: 100.0000 mg | ORAL_CAPSULE | Freq: Every day | ORAL | Status: DC | PRN

## 2016-02-21 MED ORDER — ONDANSETRON HCL 4 MG/2ML IJ SOLN
4.0000 mg | Freq: Once | INTRAMUSCULAR | Status: AC
  Administered 2016-02-21: 4 mg via INTRAVENOUS
  Filled 2016-02-21: qty 2

## 2016-02-21 MED ORDER — POLYETHYLENE GLYCOL 3350 17 G PO PACK
17.0000 g | PACK | Freq: Two times a day (BID) | ORAL | Status: DC
  Administered 2016-02-21 – 2016-02-23 (×5): 17 g via ORAL
  Filled 2016-02-21 (×7): qty 1

## 2016-02-21 MED ORDER — HYDROCODONE-ACETAMINOPHEN 5-325 MG PO TABS: 1.0000 | ORAL_TABLET | ORAL | Status: DC | PRN

## 2016-02-21 MED ORDER — DEXTROSE 50 % IV SOLN
1.0000 | Freq: Once | INTRAVENOUS | Status: AC
Start: 2016-02-21 — End: 2016-02-21
  Administered 2016-02-21: 50 mL via INTRAVENOUS
  Filled 2016-02-21: qty 50

## 2016-02-21 MED ORDER — PANTOPRAZOLE SODIUM 40 MG PO TBEC
40.0000 mg | DELAYED_RELEASE_TABLET | Freq: Every day | ORAL | Status: DC
  Administered 2016-02-21 – 2016-02-24 (×4): 40 mg via ORAL
  Filled 2016-02-21 (×4): qty 1

## 2016-02-21 MED ORDER — PATIROMER SORBITEX CALCIUM 8.4 G PO PACK
8.4000 g | PACK | Freq: Every day | ORAL | Status: DC
  Administered 2016-02-22 – 2016-02-23 (×2): 8.4 g via ORAL
  Filled 2016-02-21 (×4): qty 4

## 2016-02-21 MED ORDER — ONDANSETRON HCL 4 MG PO TABS: 4.0000 mg | ORAL_TABLET | Freq: Four times a day (QID) | ORAL | Status: DC | PRN

## 2016-02-21 MED ORDER — HALOPERIDOL 5 MG PO TABS
5.0000 mg | ORAL_TABLET | Freq: Every morning | ORAL | Status: DC
  Administered 2016-02-21 – 2016-02-24 (×4): 5 mg via ORAL
  Filled 2016-02-21 (×5): qty 1

## 2016-02-21 MED ORDER — ACETAMINOPHEN 650 MG RE SUPP: 650.0000 mg | Freq: Four times a day (QID) | RECTAL | Status: DC | PRN

## 2016-02-21 MED ORDER — SODIUM CHLORIDE 0.9 % IV SOLN
INTRAVENOUS | Status: DC
  Administered 2016-02-21: 13:00:00 via INTRAVENOUS

## 2016-02-21 MED ORDER — IOHEXOL 300 MG/ML  SOLN
25.0000 mL | INTRAMUSCULAR | Status: AC
  Administered 2016-02-21: 25 mL via ORAL

## 2016-02-21 MED ORDER — METOPROLOL TARTRATE 100 MG PO TABS
100.0000 mg | ORAL_TABLET | Freq: Two times a day (BID) | ORAL | Status: DC
  Administered 2016-02-21 – 2016-02-24 (×7): 100 mg via ORAL
  Filled 2016-02-21 (×7): qty 1

## 2016-02-21 MED ORDER — MORPHINE SULFATE (PF) 4 MG/ML IV SOLN
4.0000 mg | Freq: Once | INTRAVENOUS | Status: AC
  Administered 2016-02-21: 4 mg via INTRAVENOUS
  Filled 2016-02-21: qty 1

## 2016-02-21 MED ORDER — BENZTROPINE MESYLATE 1 MG PO TABS
1.0000 mg | ORAL_TABLET | Freq: Two times a day (BID) | ORAL | Status: DC
  Administered 2016-02-21 – 2016-02-24 (×7): 1 mg via ORAL
  Filled 2016-02-21 (×7): qty 1

## 2016-02-21 MED ORDER — ASPIRIN EC 81 MG PO TBEC
81.0000 mg | DELAYED_RELEASE_TABLET | Freq: Every day | ORAL | Status: DC
  Administered 2016-02-21 – 2016-02-24 (×4): 81 mg via ORAL
  Filled 2016-02-21 (×4): qty 1

## 2016-02-21 MED ORDER — MORPHINE SULFATE (PF) 2 MG/ML IV SOLN: 2.0000 mg | INTRAVENOUS | Status: DC | PRN

## 2016-02-21 MED ORDER — SODIUM BICARBONATE 8.4 % IV SOLN
INTRAVENOUS | Status: DC
  Administered 2016-02-21 – 2016-02-22 (×2): via INTRAVENOUS
  Filled 2016-02-21 (×4): qty 1000

## 2016-02-21 MED ORDER — HALOPERIDOL 5 MG PO TABS
10.0000 mg | ORAL_TABLET | Freq: Every day | ORAL | Status: DC
  Administered 2016-02-21 – 2016-02-23 (×3): 10 mg via ORAL
  Filled 2016-02-21 (×4): qty 2

## 2016-02-21 MED ORDER — ONDANSETRON HCL 4 MG/2ML IJ SOLN: 4.0000 mg | Freq: Four times a day (QID) | INTRAMUSCULAR | Status: DC | PRN

## 2016-02-21 MED ORDER — AMLODIPINE BESYLATE 10 MG PO TABS
10.0000 mg | ORAL_TABLET | Freq: Every day | ORAL | Status: DC
  Administered 2016-02-21 – 2016-02-24 (×4): 10 mg via ORAL
  Filled 2016-02-21 (×4): qty 1

## 2016-02-21 MED ORDER — SODIUM POLYSTYRENE SULFONATE 15 GM/60ML PO SUSP
30.0000 g | Freq: Once | ORAL | Status: AC
  Administered 2016-02-21: 30 g via ORAL
  Filled 2016-02-21: qty 120

## 2016-02-21 MED ORDER — ACETAMINOPHEN 325 MG PO TABS: 650.0000 mg | ORAL_TABLET | Freq: Four times a day (QID) | ORAL | Status: DC | PRN

## 2016-02-21 MED ORDER — HYDRALAZINE HCL 25 MG PO TABS
25.0000 mg | ORAL_TABLET | Freq: Three times a day (TID) | ORAL | Status: DC
  Administered 2016-02-21 – 2016-02-24 (×10): 25 mg via ORAL
  Filled 2016-02-21 (×10): qty 1

## 2016-02-21 MED ORDER — PATIROMER SORBITEX CALCIUM 8.4 G PO PACK
8.4000 g | PACK | Freq: Every day | ORAL | Status: DC
Start: 2016-02-21 — End: 2016-02-21

## 2016-02-21 NOTE — H&P (Signed)
[facility] History and Physical  GUILLAUME WENINGER UJW:[id number] DOB: 01/11/1946 DOA: 02/21/2016  Referring physician: Emergency Department PCP: Aura Dials, MD   CHIEF COMPLAINT:   Abdominal pain                HPI: [name] [name] is a 70 y.o. male  With multiple medical problems not limited to hypertension, chronic kidney disease stage IV, hyperlipidemia, diabetes, and schizophrenia. He has chronic, episodic RUQ pain. He has known cholelithiasis. Patient evaluated by gastroenterology who referred him for surgical evaluation in 2015. Cholecystectomy was recommended, patient did not want to proceed.  Patient comes in today for recurrent abdominal pain. Patient is a very poor historian, his story is not consistent. His ex-wife who is also power of attorney is present. Patient's son is present as well. According to the family, pain started yesterday afternoon. Patient gets these "flares" of pain from time to time. Patient believes the pain gets better with eating and also having a bowel movement but son cannot confirm this. Last bowel movement was yesterday. Patient lives with sister who is not present.  ED COURSE:           Labs:   Hemoglobin 9.4, potassium 5.3, BUN 95, creatinine 8.62, glucose 194  Urinalysis:    Cloudy, few bacteria, negative nitrite, 0-5 WBCs   EKG:    Sinus rhythm Multiple ventricular premature complexes Abnormal T, consider ischemia, diffuse leads                   Medications  iohexol (OMNIPAQUE) 300 MG/ML solution 25 mL (25 mLs Oral Contrast Given 02/21/16 0301)  morphine 4 MG/ML injection 4 mg (4 mg Intravenous Given 02/21/16 0204)  ondansetron (ZOFRAN) injection 4 mg (4 mg Intravenous Given 02/21/16 0204)  insulin aspart (novoLOG) injection 5 Units (5 Units Subcutaneous Given 02/21/16 0652)  dextrose 50 % solution 50 mL (50 mLs Intravenous Given 02/21/16 0653)  sodium polystyrene (KAYEXALATE) 15 GM/60ML suspension 30 g (30 g Oral Given 02/21/16 0653)  sodium  chloride 0.9 % bolus 500 mL (500 mLs Intravenous New Bag/Given 02/21/16 0703)    Review of Systems  Unable to perform ROS: medical condition    Past Medical History  Diagnosis Date  . Diabetes mellitus without complication (HCC)   . Hypertension   . GERD (gastroesophageal reflux disease)   . Hyperlipidemia   . Chronic kidney disease     kidney stones   Past Surgical History  Procedure Laterality Date  . No past surgeries    . Cystoscopy with retrograde pyelogram, ureteroscopy and stent placement Left 04/05/2014    Procedure: CYSTOSCOPY WITH RETROGRADE PYELOGRAM, POSSIBLE LEFT URETEROSCOPY AND LEFT STENT PLACEMENT;  Surgeon: Bjorn Pippin, MD;  Location: [facility];  Service: Urology;  Laterality: Left;  . Holmium laser application Left 04/05/2014    Procedure: HOLMIUM LASER APPLICATION;  Surgeon: Bjorn Pippin, MD;  Location: [facility];  Service: Urology;  Laterality: Left;  . Lithotripsy    . Kidney surgery      SOCIAL HISTORY:  reports that he has never smoked. He has never used smokeless tobacco. He reports that he does not drink alcohol or use illicit drugs. Lives:  At home with sister               Assistive devices:   None needed for ambulation.   Allergies  Allergen Reactions  . Bee Venom Anaphylaxis    Family History  Problem Relation Age of Onset  . Stroke Mother   .  Heart disease Father     Prior to Admission medications   Medication Sig Start Date End Date Taking? Authorizing Provider  amLODipine (NORVASC) 10 MG tablet Take 10 mg by mouth daily.   Yes Historical Provider, MD  aspirin 81 MG tablet Take 81 mg by mouth daily.   Yes Historical Provider, MD  benztropine (COGENTIN) 1 MG tablet Take 1 tablet (1 mg total) by mouth 2 (two) times daily. 01/04/16  Yes Archer Asa, MD  clotrimazole (LOTRIMIN) 1 % cream Apply 1 application topically 2 (two) times daily as needed (rash).   Yes Historical Provider, MD  docusate sodium (COLACE) 100 MG capsule Take 100 mg by mouth daily as  needed for mild constipation.   Yes Historical Provider, MD  esomeprazole (NEXIUM) 20 MG capsule Take 20 mg by mouth daily.    Yes Historical Provider, MD  haloperidol (HALDOL) 5 MG tablet Take1 tab in am, then take 2 tabs in pm 01/04/16  Yes Archer Asa, MD  hydrALAZINE (APRESOLINE) 25 MG tablet Take 1 tablet (25 mg total) by mouth 3 (three) times daily. 06/11/15  Yes Christiane Ha, MD  insulin detemir (LEVEMIR) 100 UNIT/ML injection Inject 0.05 mLs (5 Units total) into the skin at bedtime. Patient taking differently: Inject 16 Units into the skin every morning.  06/11/15  Yes Christiane Ha, MD  metoprolol (LOPRESSOR) 100 MG tablet Take 100 mg by mouth 2 (two) times daily.   Yes Historical Provider, MD  patiromer (VELTASSA) 8.4 g packet Take 8.4 g by mouth daily.   Yes Historical Provider, MD  pravastatin (PRAVACHOL) 40 MG tablet Take 40 mg by mouth every evening.    Yes Historical Provider, MD  promethazine (PHENERGAN) 25 MG suppository Place 25 mg rectally every 6 (six) hours as needed for nausea or vomiting.   Yes Historical Provider, MD  promethazine (PHENERGAN) 25 MG tablet Take 25 mg by mouth every 6 (six) hours as needed for nausea or vomiting.   Yes Historical Provider, MD   PHYSICAL EXAM: Filed Vitals:   02/21/16 0630 02/21/16 0645 02/21/16 0700 02/21/16 0715  BP: 147/86 149/76 145/70 167/93  Pulse: 83 84 90 98  Temp:      TempSrc:      Resp: SpO2: 96% 97% 96% 95%    Wt Readings from Last 3 Encounters:  01/04/16 76.114 kg (167 lb 12.8 oz)  06/11/15 86.7 kg (191 lb 2.2 oz)  06/09/14 85.821 kg (189 lb 3.2 oz)    General:  Well developed black male. Appears calm and comfortable Eyes: PER, normal lids, irises & conjunctiva ENT: grossly normal hearing, lips & tongue Neck: no LAD, no masses Cardiovascular: RRR, no murmurs. No LE edema.  Respiratory: Respirations even and unlabored. Normal respiratory effort. Lungs CTA bilaterally, no wheezes / rales .    Abdomen: soft, non-distended,active bowel sounds. No obvious masses. No significant tenderness Skin: no rash seen on limited exam Musculoskeletal: grossly normal tone BUE/BLE Psychiatric: Poor eye contact . Speech fluent and appropriate.  Neurologic: grossly non-focal.         LABS ON ADMISSION:    Basic Metabolic Panel:  Recent Labs Lab 02/20/16 1936  NA 140  K 5.3*  CL 111  CO2 16*  GLUCOSE 194*  BUN 95*  CREATININE 8.62*  CALCIUM 9.0   Liver Function Tests:  Recent Labs Lab 02/20/16 1936  AST 234*  ALT 74*  ALKPHOS 87  BILITOT 0.3  PROT 7.2  ALBUMIN 3.4*  CBC:  Recent Labs Lab 02/20/16 1936  WBC 8.1  HGB 9.4*  HCT 27.9*  MCV 87.2  PLT 166  Creatinine clearance cannot be calculated (Unknown ideal weight.)  Radiological Exams on Admission: Ct Abdomen Pelvis Wo Contrast  02/21/2016  CLINICAL DATA:  Diffuse dull abdominal pain since yesterday. Dysuria. Renal insufficiency but patient refuses dialysis. EXAM: CT ABDOMEN AND PELVIS WITHOUT CONTRAST TECHNIQUE: Multidetector CT imaging of the abdomen and pelvis was performed following the standard protocol without IV contrast. COMPARISON:  05/16/2014 FINDINGS: Atelectasis in the lung bases. 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 The unenhanced appearance of the liver, spleen, pancreas, adrenal glands, kidneys, abdominal aorta, inferior vena cava, and retroperitoneal lymph nodes is unremarkable. Cholelithiasis. No inflammatory stranding around the gallbladder. No bile duct dilatation. Stomach, small bowel, and colon are not abnormally distended. No wall thickening is appreciated. Contrast material flows to the rectum without evidence of small bowel or colonic obstruction. No free air or free fluid in the abdomen. Pelvis: The appendix is normal. Bladder wall is not thickened. Prostate gland is not enlarged. No free or loculated pelvic fluid collections. No pelvic mass or lymphadenopathy. Degenerative changes in  the spine. No destructive bone lesions. 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 Cholelithiasis without changes to suggest cholecystitis. No evidence of bowel obstruction. Electronically Signed   By: Burman NievesWilliam  Stevens M.D.   On: 02/21/2016 05:14     ASSESSMENT / PLAN    Chronic, episodic abdominal pain. Told in past that he needed a cholecystectomy. He does have  cholelithiasis but pain diffuse and involves lower abdomen making it atypical for gallbladder. No significant RUQ tenderness. Patient is a poor historian. Non-contrast CTscan negative for any acute finding.  -admit for Observation - Telemetry bed given hyperkalemia. -Abdominal ultrasound pending.  -Pain may be due in part to constipation (large amount of stool on CTscan) -Purge bowels to see if pain improves. Will give enema and Miralax after completion of ultrasound -Surgical  / GI consult depending on results / clinical course.  -repeat LFTs in am  Transaminitis. Pattern of elevation suggests ETOH though patient denies ETOH use. Could have passed a gallstone.  -await ultrasound -repeat LFTs in am   Acute on chronic renal failure. Doesn't want dialysis.  -EDP spoke with Nephrology - will see today.  -Renal ultrasound pending -If renal function doesn't improve and still refuses dialysis then Palliative Medicine may need to be consulted.  Hyperkalemia. On Veltassa at home. K+ 5.3 this am. No EKG changes Given insulin, dextrose, kayelxalate in ED -Recheck K+ this afternoon -continue Veltassa  Hypertension. Controlled -Continue home hydralazine, Lopressor, Norvasc  Diabetes, presumably type 2.  -hold home levimir insulin -CBGs and SSI -hgb a1c  Chronic anemia, normocytic, stable. Likely secondary to chronic kidney disease.   Hyperlipidemia.  -hold statin given elevated LFTs.  GERD.  -Continue home PPI  History of nephrolithiasis, obstructing.  Significant psychiatric history including  schizophrenia / Depression -Continue Cogentin, Haldol  Hx of multiple TIAs -continue home asa.  CONSULTANTS:  Nephrology    Code Status: DNR - former wife is POA. Copy of advanced directives on file. DVT Prophylaxis:  Heparin Family Communication:  POA and son in room. They both understand and agree with plan of treatment.  Disposition Plan: Discharge to home in 24-48 hours   Time spent: 60 minutes Willette ClusterPaula Ario Mcdiarmid  NP [facility] Pager [phone]

## 2016-02-21 NOTE — ED Notes (Signed)
Family member very concern that pt is not able to swallow pills, states they need to crush them down and given them on apple sauce, message to be given to day shift nurse.

## 2016-02-21 NOTE — ED Provider Notes (Signed)
CSN: [id number]     Arrival date & time 02/20/16  1821 History  By signing my name below, I, [name] [name], attest that this documentation has been prepared under the direction and in the presence of [name] Baton, MD. Electronically Signed: Bethel [name], ED Scribe. 02/21/2016. 1:52 AM   Chief Complaint  Patient presents with  . Abdominal Pain   The history is provided by the patient. No language interpreter was used.   [name] [name] is a 70 y.o. male with history of GERD, DM, HTN, HLD, and CKD who presents to the Emergency Department complaining of diffuse, 8/10 in severity, dull, abdominal pain with onset yesterday.  Pain is not worse with eating. Associated symptoms include dysuria. Pt denies vomiting, diarrhea, hematuria, chest pain, SOB, fever. Last bowel movement was "earlier". His wife states that he has history of kidney stones and gallbladder disease. Pt is seen at [facility] (Dr. Kathrene Bongo) but refuses dialysis. He also complains of left foot swelling.   Patient has been in the waiting room for over a 7 hours. Lab work is reviewed and creatinine noted to be greater than 8. Unknown most recent creatinine. Last in our system in June of last year was 4 with a stated baseline of 4-5.  Potassium 5.3.  Past Medical History  Diagnosis Date  . Diabetes mellitus without complication (HCC)   . Hypertension   . GERD (gastroesophageal reflux disease)   . Hyperlipidemia   . Chronic kidney disease     kidney stones   Past Surgical History  Procedure Laterality Date  . No past surgeries    . Cystoscopy with retrograde pyelogram, ureteroscopy and stent placement Left 04/05/2014    Procedure: CYSTOSCOPY WITH RETROGRADE PYELOGRAM, POSSIBLE LEFT URETEROSCOPY AND LEFT STENT PLACEMENT;  Surgeon: Bjorn Pippin, MD;  Location: [facility];  Service: Urology;  Laterality: Left;  . Holmium laser application Left 04/05/2014    Procedure: HOLMIUM LASER APPLICATION;  Surgeon: Bjorn Pippin, MD;   Location: [facility];  Service: Urology;  Laterality: Left;  . Lithotripsy    . Kidney surgery     Family History  Problem Relation Age of Onset  . Stroke Mother   . Heart disease Father    Social History  Substance Use Topics  . Smoking status: Never Smoker   . Smokeless tobacco: Never Used  . Alcohol Use: No    Review of Systems  Constitutional: Negative for fever.  Respiratory: Negative for shortness of breath.   Cardiovascular: Negative for chest pain.  Gastrointestinal: Positive for abdominal pain. Negative for vomiting and diarrhea.  Genitourinary: Positive for dysuria. Negative for hematuria.  Musculoskeletal:       Left foot swelling  All other systems reviewed and are negative.   Allergies  Bee venom  Home Medications   Prior to Admission medications   Medication Sig Start Date End Date Taking? Authorizing Provider  amLODipine (NORVASC) 10 MG tablet Take 10 mg by mouth daily.   Yes Historical Provider, MD  aspirin 81 MG tablet Take 81 mg by mouth daily.   Yes Historical Provider, MD  benztropine (COGENTIN) 1 MG tablet Take 1 tablet (1 mg total) by mouth 2 (two) times daily. 01/04/16  Yes Archer Asa, MD  clotrimazole (LOTRIMIN) 1 % cream Apply 1 application topically 2 (two) times daily as needed (rash).   Yes Historical Provider, MD  docusate sodium (COLACE) 100 MG capsule Take 100 mg by mouth daily as needed for mild constipation.   Yes Historical  Provider, MD  esomeprazole (NEXIUM) 20 MG capsule Take 20 mg by mouth daily.    Yes Historical Provider, MD  haloperidol (HALDOL) 5 MG tablet Take1 tab in am, then take 2 tabs in pm 01/04/16  Yes Archer Asa, MD  hydrALAZINE (APRESOLINE) 25 MG tablet Take 1 tablet (25 mg total) by mouth 3 (three) times daily. 06/11/15  Yes Christiane Ha, MD  insulin detemir (LEVEMIR) 100 UNIT/ML injection Inject 0.05 mLs (5 Units total) into the skin at bedtime. Patient taking differently: Inject 16 Units into the skin every  morning.  06/11/15  Yes Christiane Ha, MD  metoprolol (LOPRESSOR) 100 MG tablet Take 100 mg by mouth 2 (two) times daily.   Yes Historical Provider, MD  patiromer (VELTASSA) 8.4 g packet Take 8.4 g by mouth daily.   Yes Historical Provider, MD  pravastatin (PRAVACHOL) 40 MG tablet Take 40 mg by mouth every evening.    Yes Historical Provider, MD  promethazine (PHENERGAN) 25 MG suppository Place 25 mg rectally every 6 (six) hours as needed for nausea or vomiting.   Yes Historical Provider, MD  promethazine (PHENERGAN) 25 MG tablet Take 25 mg by mouth every 6 (six) hours as needed for nausea or vomiting.   Yes Historical Provider, MD   BP 131/66 mmHg  Pulse 86  Temp(Src) 98.4 F (36.9 C) (Oral)  Resp 18  SpO2 100% Physical Exam  Constitutional: He is oriented to person, place, and time. No distress.  Chronically ill-appearing, no acute distress  HENT:  Head: Normocephalic and atraumatic.  mucous membranes dry  Cardiovascular: Normal rate, regular rhythm and normal heart sounds.   No murmur heard. Pulmonary/Chest: Effort normal and breath sounds normal. No respiratory distress. He has no wheezes.  Abdominal: Soft. Bowel sounds are normal. There is tenderness. There is no rebound and no guarding.  Diffuse upper abdominal tenderness to palpation without rebound or guarding  Neurological: He is alert and oriented to person, place, and time.  Skin: Skin is warm and dry.  Psychiatric: He has a normal mood and affect.  Nursing note and vitals reviewed.   ED Course  Procedures (including critical care time) CRITICAL CARE Performed by: [name] [name]   Total critical care time: 40 minutes  Critical care time was exclusive of separately billable procedures and treating other patients.  Critical care was necessary to treat or prevent imminent or life-threatening deterioration.  Critical care was time spent personally by me on the following activities: development of treatment  plan with patient and/or surrogate as well as nursing, discussions with consultants, evaluation of patient's response to treatment, examination of patient, obtaining history from patient or surrogate, ordering and performing treatments and interventions, ordering and review of laboratory studies, ordering and review of radiographic studies, pulse oximetry and re-evaluation of patient's condition.  DIAGNOSTIC STUDIES: Oxygen Saturation is 100% on RA,  normal by my interpretation.    COORDINATION OF CARE: 1:39 AM Discussed treatment plan which includes lab work with pt at bedside and pt agreed to plan.  Labs Review Labs Reviewed  COMPREHENSIVE METABOLIC PANEL - Abnormal; Notable for the following:    Potassium 5.3 (*)    CO2 16 (*)    Glucose, Bld 194 (*)    BUN 95 (*)    Creatinine, Ser 8.62 (*)    Albumin 3.4 (*)    AST 234 (*)    ALT 74 (*)    GFR calc non Af Amer 5 (*)    GFR calc  Af Amer 6 (*)    All other components within normal limits  CBC - Abnormal; Notable for the following:    RBC 3.20 (*)    Hemoglobin 9.4 (*)    HCT 27.9 (*)    All other components within normal limits  URINALYSIS, ROUTINE W REFLEX MICROSCOPIC (NOT AT [facility]) - Abnormal; Notable for the following:    APPearance CLOUDY (*)    Hgb urine dipstick LARGE (*)    Protein, ur 100 (*)    All other components within normal limits  URINE MICROSCOPIC-ADD ON - Abnormal; Notable for the following:    Squamous Epithelial / LPF 0-5 (*)    Bacteria, UA FEW (*)    All other components within normal limits  LIPASE, BLOOD    Imaging Review Ct Abdomen Pelvis Wo Contrast  02/21/2016  CLINICAL DATA:  Diffuse dull abdominal pain since yesterday. Dysuria. Renal insufficiency but patient refuses dialysis. EXAM: CT ABDOMEN AND PELVIS WITHOUT CONTRAST TECHNIQUE: Multidetector CT imaging of the abdomen and pelvis was performed following the standard protocol without IV contrast. COMPARISON:  05/16/2014 FINDINGS: Atelectasis in  the lung bases. 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 The unenhanced appearance of the liver, spleen, pancreas, adrenal glands, kidneys, abdominal aorta, inferior vena cava, and retroperitoneal lymph nodes is unremarkable. Cholelithiasis. No inflammatory stranding around the gallbladder. No bile duct dilatation. Stomach, small bowel, and colon are not abnormally distended. No wall thickening is appreciated. Contrast material flows to the rectum without evidence of small bowel or colonic obstruction. No free air or free fluid in the abdomen. Pelvis: The appendix is normal. Bladder wall is not thickened. Prostate gland is not enlarged. No free or loculated pelvic fluid collections. No pelvic mass or lymphadenopathy. Degenerative changes in the spine. No destructive bone lesions. 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 Cholelithiasis without changes to suggest cholecystitis. No evidence of bowel obstruction. Electronically Signed   By: Burman NievesWilliam  Stevens M.D.   On: 02/21/2016 05:14   I have personally reviewed and evaluated these images and lab results as part of my medical decision-making.   EKG Interpretation   Date/Time:  Tuesday February 21 2016 06:52:04 EST Ventricular Rate:  85 PR Interval:  190 QRS Duration: 79 QT Interval:  350 QTC Calculation: 416 R Axis:   9 Text Interpretation:  Sinus rhythm Multiple ventricular premature  complexes Abnormal T, consider ischemia, diffuse leads Confirmed by Biff Rutigliano   MD, Cici Rodriges ([id number]) on 02/21/2016 7:01:20 AM Also confirmed by Wilkie AyeHORTON  MD,  Jama Krichbaum ([id number]), editor WALKER, CCT, SANDRA ([id number])  on 02/21/2016 7:12:45  AM      MDM   Final diagnoses:  Abdominal pain  Acute on chronic renal failure (HCC)  Hyperkalemia    Patient presents with abdominal pain. History of chronic renal disease. Last creatinine baseline 4-5. Currently 8 with a potassium of 5.3. No EKG changes. Abdominal pain is diffuse over the upper abdomen.  Not classic for anything. No associated symptoms.  Vital signs are reassuring. Basic labwork reviewed from waiting room. Mild elevation in LFTs. CT abdomen was obtained given diffuse nature of pain and in order to visualize the kidneys. CT abdomen is largely unremarkable with the exception of cholelithiasis. No changes suggestive of cholecystitis. Patient symptoms may be related to biliary colic. Given increasing creatinine, so patient may benefit from hospitalization for stabilization of potassium and kidney function. He is not interested in dialysis. Discussed with Dr. Arrie Aranoladonato, nephrology. They will evaluate patient. Agree with Kayexalate,  insulin, and glucose.  Discussed with tried hospitalist, [place], [place]. Patient will be admitted. Have added right upper quadrant ultrasound as well as renal ultrasound for completeness of workup.   I personally performed the services described in this documentation, which was scribed in my presence. The recorded information has been reviewed and is accurate.     [name] Baton, MD 02/21/16 [id number]

## 2016-02-21 NOTE — ED Notes (Signed)
Attempted report 

## 2016-02-21 NOTE — Consult Note (Signed)
[facility] Consultation Note Requesting Physician:  [facility] Reason for Consult:  Advanced CKD Primary Nephrologist: Dr. Annie Sable  HPI: The patient is a 70 y.o. year-old male with a background of DM, HTN, schizophrenia, GERD, HLD.  He has know CKD - advanced - followed by Dr. Kathrene Bongo. He was last seen in the office 12/23/15 - he had made it clear to family and to Dr. Justice Deeds that he did not plan to ever undergo dialysis.  He declined Kidney Options class, vascular access placement.  He was using Veltassa for management of potassium, phenergan for nausea, and Dr. Kathrene Bongo had Palliative Care "on standby" if needed. Family all on board with this plan     He is admitted on the present occasion with abdominal pain and has had a history of RUQ pain off and on, though apparently could not give a very good history on admission. Has had prior cholecystitis but has declined surgery.  Pain was mostly lower abdominal, felt possibly in part related to constipation.  Transaminases were elevated. Getting workup.   We were called because his creatinine was elevated at 8.62. K was 5.3.  When he last saw Dr. Kathrene Bongo in January it was 5.86. Primary team started IVF.  NS at 75/hour.  Creatinine trending for reference is as follows.  CREATININE, SER  Date/Time Value Ref Range Status  02/21/2016 09:15 AM 8.18* 0.61 - 1.24 mg/dL Final  16/09/9603 54:09 PM 8.62* 0.61 - 1.24 mg/dL Final  81/19/1478 2.95 ([facility])    06/11/2015 03:05 AM 3.83* 0.61 - 1.24 mg/dL Final  62/13/0865 78:46 AM 3.75* 0.61 - 1.24 mg/dL Final  96/29/5284 13:24 AM 3.94* 0.61 - 1.24 mg/dL Final  40/09/2724 36:64 AM 4.20* 0.61 - 1.24 mg/dL Final  40/34/7425 95:63 PM 4.42* 0.61 - 1.24 mg/dL Final  87/56/4332 95:18 PM 4.75* 0.61 - 1.24 mg/dL Final  84/16/6063 01:60 PM 4.74* 0.61 - 1.24 mg/dL Final   I spoke with the patient's son who confirms the plan that the family/patient and Dr. Kathrene Bongo worked out after extensive discussion -  there is no plan for him to undergo dialysis, and at end stage renal disease Hospice/Palliative Care was and remains the plan.    Past Medical History  Diagnosis Date  . Diabetes mellitus without complication (HCC)   . Hypertension   . GERD (gastroesophageal reflux disease)   . Hyperlipidemia   . Chronic kidney disease     kidney stones     Past Surgical History  Procedure Laterality Date  . No past surgeries    . Cystoscopy with retrograde pyelogram, ureteroscopy and stent placement Left 04/05/2014    Procedure: CYSTOSCOPY WITH RETROGRADE PYELOGRAM, POSSIBLE LEFT URETEROSCOPY AND LEFT STENT PLACEMENT;  Surgeon: Bjorn Pippin, MD;  Location: [facility];  Service: Urology;  Laterality: Left;  . Holmium laser application Left 04/05/2014    Procedure: HOLMIUM LASER APPLICATION;  Surgeon: Bjorn Pippin, MD;  Location: [facility];  Service: Urology;  Laterality: Left;  . Lithotripsy    . Kidney surgery       Family History  Problem Relation Age of Onset  . Stroke Mother   . Heart disease Father    Social History:  reports that he has never smoked. He has never used smokeless tobacco. He reports that he does not drink alcohol or use illicit drugs.   Allergies  Allergen Reactions  . Bee Venom Anaphylaxis     Prior to Admission medications   Medication Sig Start Date End Date Taking? Authorizing Provider  amLODipine (NORVASC) 10 MG tablet Take 10 mg by mouth daily.   Yes Historical Provider, MD  aspirin 81 MG tablet Take 81 mg by mouth daily.   Yes Historical Provider, MD  benztropine (COGENTIN) 1 MG tablet Take 1 tablet (1 mg total) by mouth 2 (two) times daily. 01/04/16  Yes Archer Asa, MD  clotrimazole (LOTRIMIN) 1 % cream Apply 1 application topically 2 (two) times daily as needed (rash).   Yes Historical Provider, MD  docusate sodium (COLACE) 100 MG capsule Take 100 mg by mouth daily as needed for mild constipation.   Yes Historical Provider, MD  esomeprazole (NEXIUM) 20 MG capsule Take  20 mg by mouth daily.    Yes Historical Provider, MD  haloperidol (HALDOL) 5 MG tablet Take1 tab in am, then take 2 tabs in pm 01/04/16  Yes Archer Asa, MD  hydrALAZINE (APRESOLINE) 25 MG tablet Take 1 tablet (25 mg total) by mouth 3 (three) times daily. 06/11/15  Yes Christiane Ha, MD  insulin detemir (LEVEMIR) 100 UNIT/ML injection Inject 0.05 mLs (5 Units total) into the skin at bedtime. Patient taking differently: Inject 16 Units into the skin every morning.  06/11/15  Yes Christiane Ha, MD  metoprolol (LOPRESSOR) 100 MG tablet Take 100 mg by mouth 2 (two) times daily.   Yes Historical Provider, MD  patiromer (VELTASSA) 8.4 g packet Take 8.4 g by mouth daily.   Yes Historical Provider, MD  pravastatin (PRAVACHOL) 40 MG tablet Take 40 mg by mouth every evening.    Yes Historical Provider, MD  promethazine (PHENERGAN) 25 MG suppository Place 25 mg rectally every 6 (six) hours as needed for nausea or vomiting.   Yes Historical Provider, MD  promethazine (PHENERGAN) 25 MG tablet Take 25 mg by mouth every 6 (six) hours as needed for nausea or vomiting.   Yes Historical Provider, MD    Inpatient medications: . amLODipine  10 mg Oral Daily  . aspirin EC  81 mg Oral Daily  . benztropine  1 mg Oral BID  . haloperidol  10 mg Oral QHS  . haloperidol  5 mg Oral q morning - 10a  . heparin  5,000 Units Subcutaneous 3 times per day  . hydrALAZINE  25 mg Oral TID  . insulin aspart  0-9 Units Subcutaneous TID WC  . metoprolol  100 mg Oral BID  . milk and molasses  1 enema Rectal Once  . pantoprazole  40 mg Oral Daily  . patiromer  8.4 g Oral Daily  . polyethylene glycol  17 g Oral BID  . sodium chloride flush  3 mL Intravenous Q12H   . sodium chloride 75 mL/hr at 02/21/16 1230   Review of Systems Pt cannot really give full ROS  Physical Exam:  Blood pressure 132/60, pulse 84, temperature 98.1 F (36.7 C), temperature source Oral, resp. rate 19, height 5' 10.5" (1.791 m), weight 78.155  kg (172 lb 4.8 oz), SpO2 97 %.  Gen: Older AAM, answers questions, hard to direct but pleasant Skin: no rash, cyanosis Neck: no JVD, no bruits or LAN Chest: Clear Heart: Regular. S1S2 No S3 Abdomen: Protuberant, soft, mostly tender LLQ No rebound Ext: No sig edema Neuro: Slow, a little lethargic. Oriented to person/place   Labs: Basic Metabolic Panel:  Recent Labs Lab 02/20/16 1936 02/21/16 0915  NA 140 140  K 5.3* 4.5  CL 111 112*  CO2 16* 17*  GLUCOSE 194* 87  BUN 95* 97*  CREATININE 8.62* 8.18*  CALCIUM 9.0 9.0     Recent Labs Lab 02/20/16 1936  AST 234*  ALT 74*  ALKPHOS 87  BILITOT 0.3  PROT 7.2  ALBUMIN 3.4*    Recent Labs Lab 02/20/16 1936  LIPASE 27     Recent Labs Lab 02/20/16 1936  WBC 8.1  HGB 9.4*  HCT 27.9*  MCV 87.2  PLT 166     Recent Labs Lab 02/21/16 0917 02/21/16 1205  GLUCAP 69 93     Xrays/Other Studies: Ct Abdomen Pelvis Wo Contrast  02/21/2016  CLINICAL DATA:  Diffuse dull abdominal pain since yesterday. Dysuria. Renal insufficiency but patient refuses dialysis. EXAM: CT ABDOMEN AND PELVIS WITHOUT CONTRAST TECHNIQUE: Multidetector CT imaging of the abdomen and pelvis was performed following the standard protocol without IV contrast. COMPARISON:  05/16/2014 FINDINGS: Atelectasis in the lung bases. 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 The unenhanced appearance of the liver, spleen, pancreas, adrenal glands, kidneys, abdominal aorta, inferior vena cava, and retroperitoneal lymph nodes is unremarkable. Cholelithiasis. No inflammatory stranding around the gallbladder. No bile duct dilatation. Stomach, small bowel, and colon are not abnormally distended. No wall thickening is appreciated. Contrast material flows to the rectum without evidence of small bowel or colonic obstruction. No free air or free fluid in the abdomen. Pelvis: The appendix is normal. Bladder wall is not thickened. Prostate gland is not enlarged. No  free or loculated pelvic fluid collections. No pelvic mass or lymphadenopathy. Degenerative changes in the spine. No destructive bone lesions. 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 Cholelithiasis without changes to suggest cholecystitis. No evidence of bowel obstruction. Electronically Signed   By: Burman Nieves M.D.   On: 02/21/2016 05:14   US Renal  02/21/2016  CLINICAL DATA:  Worsening renal failure. History of hypertension, diabetes and chronic kidney disease. EXAM: RENAL / URINARY TRACT ULTRASOUND COMPLETE COMPARISON:  Renal ultrasound 06/09/2015.  Abdominal CT 02/21/2016. FINDINGS: Right Kidney: Length: 10.7 cm. The renal cortical echogenicity is increased. There is no significant cortical thinning, focal lesion or hydronephrosis. Left Kidney: Length: 11.0 cm. The renal cortical echogenicity is increased. There is no significant cortical thinning, focal lesion or hydronephrosis. Bladder: Appears normal for degree of bladder distention. IMPRESSION: 1. Both kidneys are similar in size to prior studies without hydronephrosis. 2. Interval development of increased cortical echogenicity bilaterally, suggesting medical renal disease. No significant cortical thinning. Electronically Signed   By: Carey Bullocks M.D.   On: 02/21/2016 10:32   US Abdomen Limited Ruq  02/21/2016  CLINICAL DATA:  RIGHT upper quadrant pain since midnight, history hypertension, diabetes mellitus, GERD EXAM: US ABDOMEN LIMITED - RIGHT UPPER QUADRANT COMPARISON:  CT abdomen pelvis 02/21/2016 FINDINGS: Gallbladder: Shadowing gallstones in gallbladder up to 11 mm diameter. No gallbladder wall thickening, pericholecystic fluid or sonographic Murphy sign. Common bile duct: Diameter: 4 mm diameter , normal Liver: Normal appearance.  Hepatopetal portal venous flow. No RIGHT upper quadrant free fluid. IMPRESSION: Cholelithiasis without evidence acute cholecystitis. Electronically Signed   By: Ulyses Southward M.D.   On:  02/21/2016 10:51   Background 70 y.o. year-old male with a background of DM, HTN, schizophrenia, GERD, HLD.  He has known CKD - advanced - followed by Dr. Kathrene Bongo. He was last seen in the office 12/23/15 - he had made it clear to family and to Dr. Justice Deeds that he did not plan to ever undergo dialysis.  He declined Kidney Options class, vascular access placement.  He was using Veltassa for management of  potassium, phenergan for nausea, and Dr. Kathrene [place] had Palliative Care "on standby" if needed.  Family all on board.   He is admitted on the present occasion with abdominal pain and has had a history of RUQ pain off and on, though he cannot give a very good history. Has had prior cholecystitis but has declined surgery.  Pain was mostly lower abdominal, felt possibly in part related to constipation.  Transaminases were elevated. Getting workup.   We were called because his creatinine was elevated at 8.62. K was 5.3.  When he last saw Dr. Kathrene BongoGoldsborough in January it was 5.86. Primary team started IVF.    Impression/Recommendations  1. Advanced stage 5 CKD - I spoke with the patient's son who confirms the plan that the family/patient and Dr. Kathrene BongoGoldsborough worked out after extensive discussion - there is no plan for him to undergo dialysis, and at end stage renal disease Hospice/Palliative Care was and remains the plan.  I recommend being careful with aggressive IVF so as to avoid volume overload/CHF issues.   He can continue Veltassa for hyperkalemia.  If he continues to decline recommend moving quickly toward palliative care. 2. Abdominal pain - workup per primary 3. Hyperkalemia - responded nicely to conservative Rx 4. Metabolic acidosis - Change fluids ti isotonic bicarb  (and slow down the rate to 50 cc/hour) 5. Anemia - no transfusion need 6. Schizophrenia - continue usual meds   Camille Balynthia Clyda Smyth,  MD [facility] [phone] pager 02/21/2016, 2:09 PM

## 2016-02-21 NOTE — ED Notes (Signed)
Patient transported to CT 

## 2016-02-21 NOTE — Progress Notes (Signed)
Responded to page from Nurse Practitioner Gunnar Fusi(Paula [phone] ) to examine Patient's  HPOA  for accuracy. Document was reviewed and  confirmed by SCW Director and myself as being legal and properly completed based upon reading and information provided us. I followed up with NP informing her that document was properly witnessed and notarized. Chaplain services available as needed.   02/21/16 1000  Clinical Encounter Type  Visited With Patient and family together;Health care provider  Visit Type Initial  Referral From Physician;Other (Comment)  Consult/Referral To Actuary(Nurse Practicianor)  Spiritual Encounters  Spiritual Needs Other (Comment) (Advance Directive)  Stress Factors  Patient Stress Factors Not reviewed  Family Stress Factors None identified  Advance Directives (For Healthcare)  Does patient have an advance directive? Yes  Venida JarvisWatlington, Jehieli Brassell, Chaplain,pager [phone]

## 2016-02-21 NOTE — ED Notes (Signed)
Fluid challenge started. 

## 2016-02-22 DIAGNOSIS — N189 Chronic kidney disease, unspecified: Secondary | ICD-10-CM

## 2016-02-22 DIAGNOSIS — F209 Schizophrenia, unspecified: Secondary | ICD-10-CM | POA: Diagnosis not present

## 2016-02-22 DIAGNOSIS — R109 Unspecified abdominal pain: Secondary | ICD-10-CM

## 2016-02-22 DIAGNOSIS — N179 Acute kidney failure, unspecified: Secondary | ICD-10-CM

## 2016-02-22 DIAGNOSIS — R740 Nonspecific elevation of levels of transaminase and lactic acid dehydrogenase [LDH]: Secondary | ICD-10-CM

## 2016-02-22 DIAGNOSIS — E118 Type 2 diabetes mellitus with unspecified complications: Secondary | ICD-10-CM

## 2016-02-22 DIAGNOSIS — G8929 Other chronic pain: Secondary | ICD-10-CM

## 2016-02-22 DIAGNOSIS — D631 Anemia in chronic kidney disease: Secondary | ICD-10-CM

## 2016-02-22 LAB — COMPREHENSIVE METABOLIC PANEL
ALT: 84 U/L — ABNORMAL HIGH (ref 17–63)
AST: 204 U/L — ABNORMAL HIGH (ref 15–41)
Albumin: 2.7 g/dL — ABNORMAL LOW (ref 3.5–5.0)
Alkaline Phosphatase: 73 U/L (ref 38–126)
Anion gap: 11 (ref 5–15)
BILIRUBIN TOTAL: 0.3 mg/dL (ref 0.3–1.2)
BUN: 84 mg/dL — ABNORMAL HIGH (ref 6–20)
CHLORIDE: 111 mmol/L (ref 101–111)
CO2: 19 mmol/L — ABNORMAL LOW (ref 22–32)
CREATININE: 7.46 mg/dL — AB (ref 0.61–1.24)
Calcium: 8.4 mg/dL — ABNORMAL LOW (ref 8.9–10.3)
GFR, EST AFRICAN AMERICAN: 8 mL/min — AB (ref >60)
GFR, EST NON AFRICAN AMERICAN: 7 mL/min — AB (ref >60)
Glucose, Bld: 168 mg/dL — ABNORMAL HIGH (ref 65–99)
POTASSIUM: 4.5 mmol/L (ref 3.5–5.1)
Sodium: 141 mmol/L (ref 135–145)
TOTAL PROTEIN: 6.3 g/dL — AB (ref 6.5–8.1)

## 2016-02-22 LAB — GLUCOSE, CAPILLARY
Glucose-Capillary: 138 mg/dL — ABNORMAL HIGH (ref 65–99)
Glucose-Capillary: 185 mg/dL — ABNORMAL HIGH (ref 65–99)
Glucose-Capillary: 238 mg/dL — ABNORMAL HIGH (ref 65–99)
Glucose-Capillary: 142 mg/dL — ABNORMAL HIGH (ref 65–99)

## 2016-02-22 LAB — CBC
HEMATOCRIT: 27.6 % — AB (ref 39.0–52.0)
Hemoglobin: 9.1 g/dL — ABNORMAL LOW (ref 13.0–17.0)
MCH: 29.1 pg (ref 26.0–34.0)
MCHC: 33 g/dL (ref 30.0–36.0)
MCV: 88.2 fL (ref 78.0–100.0)
PLATELETS: 143 10*3/uL — AB (ref 150–400)
RBC: 3.13 MIL/uL — AB (ref 4.22–5.81)
RDW: 12.8 % (ref 11.5–15.5)
WBC: 6 10*3/uL (ref 4.0–10.5)

## 2016-02-22 LAB — HEMOGLOBIN A1C
HEMOGLOBIN A1C: 7.1 % — AB (ref 4.8–5.6)
MEAN PLASMA GLUCOSE: 157 mg/dL

## 2016-02-22 MED ORDER — DOCUSATE SODIUM 100 MG PO CAPS
100.0000 mg | ORAL_CAPSULE | Freq: Two times a day (BID) | ORAL | Status: DC
  Administered 2016-02-22 – 2016-02-24 (×2): 100 mg via ORAL
  Filled 2016-02-22 (×4): qty 1

## 2016-02-22 NOTE — Care Management Obs Status (Signed)
MEDICARE OBSERVATION STATUS NOTIFICATION   Patient Details  Name: [name] LairFrancis B Raspberry MRN: [id number] Date of Birth: January 06, 1946   Medicare Observation Status Notification Given:  Yes    Epifanio LeschesCole, Antwonette Feliz Hudson, RN 02/22/2016, 6:14 PM

## 2016-02-22 NOTE — Progress Notes (Signed)
[facility] PROGRESS NOTE  [name] [name] WUJ:[id number] DOB: 1946-11-07 DOA: 02/21/2016  PCP: Aura Dials, MD  Brief HPI: 70 year old African-American male with past medical history of chronic kidney disease stage V, who has refused dialysis on multiple occasions, history of diabetes, hypertension, presented with complaints of abdominal pain. He was found to have acute renal failure. Patient was hospitalized for further management.  Past medical history:  Past Medical History  Diagnosis Date  . Diabetes mellitus without complication (HCC)   . Hypertension   . GERD (gastroesophageal reflux disease)   . Hyperlipidemia   . Chronic kidney disease     kidney stones    Consultants: Nephrology  Procedures: None  Antibiotics: None  Subjective: Patient is not really communicative due to his underlying mental health illness. History is very limited.  Objective: Vital Signs  Filed Vitals:   02/21/16 1210 02/21/16 1333 02/21/16 1816 02/22/16 0657  BP: 150/72 132/60 137/77 160/76  Pulse: 86 84  78  Temp:  98.1 F (36.7 C)  98.1 F (36.7 C)  TempSrc:      Resp:  19  18  Height:      Weight:      SpO2: 100% 97%  100%    Intake/Output Summary (Last 24 hours) at 02/22/16 9562 Last data filed at 02/21/16 1856  Gross per 24 hour  Intake 394.17 ml  Output    370 ml  Net  24.17 ml   Filed Weights   02/21/16 0912  Weight: 78.155 kg (172 lb 4.8 oz)    General appearance: alert, cooperative, appears stated age, distracted and no distress Resp: clear to auscultation bilaterally Cardio: regular rate and rhythm, S1, S2 normal, no murmur, click, rub or gallop GI: soft, non-tender; bowel sounds normal; no masses,  no organomegaly Extremities: extremities normal, atraumatic, no cyanosis or edema Neurologic: Awake and alert. Distracted. No obvious focal neurological deficits.  Lab Results:  Basic Metabolic Panel:  Recent Labs Lab 02/20/16 1936 02/21/16 0915  02/22/16 0500  NA 140 140 141  K 5.3* 4.5 4.5  CL 111 112* 111  CO2 16* 17* 19*  GLUCOSE 194* 87 168*  BUN 95* 97* 84*  CREATININE 8.62* 8.18* 7.46*  CALCIUM 9.0 9.0 8.4*   Liver Function Tests:  Recent Labs Lab 02/20/16 1936 02/22/16 0500  AST 234* 204*  ALT 74* 84*  ALKPHOS 87 73  BILITOT 0.3 0.3  PROT 7.2 6.3*  ALBUMIN 3.4* 2.7*    Recent Labs Lab 02/20/16 1936  LIPASE 27   CBC:  Recent Labs Lab 02/20/16 1936 02/22/16 0500  WBC 8.1 6.0  HGB 9.4* 9.1*  HCT 27.9* 27.6*  MCV 87.2 88.2  PLT 166 143*   CBG:  Recent Labs Lab 02/21/16 0917 02/21/16 1205 02/21/16 1706 02/21/16 2103 02/22/16 0739  GLUCAP 69 93 170* 165* 138*    No results found for this or any previous visit (from the past 240 hour(s)).    Studies/Results: Ct Abdomen Pelvis Wo Contrast  02/21/2016  CLINICAL DATA:  Diffuse dull abdominal pain since yesterday. Dysuria. Renal insufficiency but patient refuses dialysis. EXAM: CT ABDOMEN AND PELVIS WITHOUT CONTRAST TECHNIQUE: Multidetector CT imaging of the abdomen and pelvis was performed following the standard protocol without IV contrast. COMPARISON:  05/16/2014 FINDINGS: Atelectasis in the lung bases. 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 The unenhanced appearance of the liver, spleen, pancreas, adrenal glands, kidneys, abdominal aorta, inferior vena cava, and retroperitoneal lymph nodes is unremarkable. Cholelithiasis. No inflammatory  stranding around the gallbladder. No bile duct dilatation. Stomach, small bowel, and colon are not abnormally distended. No wall thickening is appreciated. Contrast material flows to the rectum without evidence of small bowel or colonic obstruction. No free air or free fluid in the abdomen. Pelvis: The appendix is normal. Bladder wall is not thickened. Prostate gland is not enlarged. No free or loculated pelvic fluid collections. No pelvic mass or lymphadenopathy. Degenerative changes in the spine. No  destructive bone lesions. 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 Cholelithiasis without changes to suggest cholecystitis. No evidence of bowel obstruction. Electronically Signed   By: Burman Nieves M.D.   On: 02/21/2016 05:14   US Renal  02/21/2016  CLINICAL DATA:  Worsening renal failure. History of hypertension, diabetes and chronic kidney disease. EXAM: RENAL / URINARY TRACT ULTRASOUND COMPLETE COMPARISON:  Renal ultrasound 06/09/2015.  Abdominal CT 02/21/2016. FINDINGS: Right Kidney: Length: 10.7 cm. The renal cortical echogenicity is increased. There is no significant cortical thinning, focal lesion or hydronephrosis. Left Kidney: Length: 11.0 cm. The renal cortical echogenicity is increased. There is no significant cortical thinning, focal lesion or hydronephrosis. Bladder: Appears normal for degree of bladder distention. IMPRESSION: 1. Both kidneys are similar in size to prior studies without hydronephrosis. 2. Interval development of increased cortical echogenicity bilaterally, suggesting medical renal disease. No significant cortical thinning. Electronically Signed   By: Carey Bullocks M.D.   On: 02/21/2016 10:32   US Abdomen Limited Ruq  02/21/2016  CLINICAL DATA:  RIGHT upper quadrant pain since midnight, history hypertension, diabetes mellitus, GERD EXAM: US ABDOMEN LIMITED - RIGHT UPPER QUADRANT COMPARISON:  CT abdomen pelvis 02/21/2016 FINDINGS: Gallbladder: Shadowing gallstones in gallbladder up to 11 mm diameter. No gallbladder wall thickening, pericholecystic fluid or sonographic Murphy sign. Common bile duct: Diameter: 4 mm diameter , normal Liver: Normal appearance.  Hepatopetal portal venous flow. No RIGHT upper quadrant free fluid. IMPRESSION: Cholelithiasis without evidence acute cholecystitis. Electronically Signed   By: Ulyses Southward M.D.   On: 02/21/2016 10:51    Medications:  Scheduled: . amLODipine  10 mg Oral Daily  . aspirin EC  81 mg Oral Daily  .  benztropine  1 mg Oral BID  . docusate sodium  100 mg Oral BID  . haloperidol  10 mg Oral QHS  . haloperidol  5 mg Oral q morning - 10a  . heparin  5,000 Units Subcutaneous 3 times per day  . hydrALAZINE  25 mg Oral TID  . insulin aspart  0-9 Units Subcutaneous TID WC  . metoprolol  100 mg Oral BID  . milk and molasses  1 enema Rectal Once  . pantoprazole  40 mg Oral Daily  . patiromer  8.4 g Oral Daily  . polyethylene glycol  17 g Oral BID  . sodium chloride flush  3 mL Intravenous Q12H   Continuous: . dextrose 5 % 1,000 mL with sodium bicarbonate 150 mEq infusion 50 mL/hr at 02/21/16 1815   WUJ:[id number] **OR** acetaminophen, bisacodyl, HYDROcodone-acetaminophen, morphine injection, ondansetron **OR** ondansetron (ZOFRAN) IV  Assessment/Plan:  Active Problems:   Diabetes (HCC)   Hypertension   Hyperkalemia   Schizophrenia (HCC)   Acute on chronic renal failure (HCC)   Chronic abdominal pain   Transaminitis   Abdominal pain   Anemia in chronic kidney disease   Diabetes mellitus with complication (HCC)    Chronic, episodic abdominal pain Patient has been told in the past that he needed a cholecystectomy. He does havecholelithiasis but pain  diffuse and involves lower abdomen making it atypical for gallbladder. No significant RUQ tenderness. Patient is a poor historian. Non-contrast CTscan negative for any acute finding. Seems to be comfortable this morning. Tolerating his meals. Continue stool softeners and laxatives as constipation could be causing some of his symptoms.. Abdominal ultrasound does not show any evidence for cholecystitis.  Transaminitis Pattern of elevation suggests ETOH though patient denies ETOH use. Could have passed a gallstone. Repeat LFTs are stable. Continue to monitor for now. Ultrasound does not show any acute findings in the hepatic biliary system.  Acute on chronic renal failure Doesn't want dialysis. Patient seen by nephrology as well.  Continue gentle IV hydration for now. No hydronephrosis noted on ultrasound. If renal function doesn't improve as expected then Palliative Medicine may need to be consulted.  Hyperkalemia On Veltassa at home. Potassium level has improved. Patient was given insulin, dextrose, kayelxalate in ED.   Essential Hypertension Continue home hydralazine, Lopressor, Norvasc  Diabetes, presumably type 2.  Holding home levimir insulin. CBGs and SSI. HbA1c is 7.1.  Chronic anemia, normocytic Stable. Likely secondary to chronic kidney disease.   Hyperlipidemia.  Holding statin given elevated LFTs.  GERD.  Continue home PPI  History of nephrolithiasis, obstructing. Not an active issue currently.  Significant psychiatric history including schizophrenia / Depression Continue Cogentin, Haldol  Hx of multiple TIAs Continue home asa.   DVT Prophylaxis: Subcutaneous heparin    Code Status: DO NOT RESUSCITATE  Family Communication: No family at bedside  Disposition Plan: Await further improvement in renal function. Mobilize.    LOS: 1 day   [facility] Eye Surgery CenterKRISHNAN,[name] [name]  [facility] Pager [phone] 02/22/2016, 9:25 AM  If 7PM-7AM, please contact night-coverage at [email or web address], password [email or web address]

## 2016-02-22 NOTE — Progress Notes (Signed)
[facility] Rounding Note Subjective:   PLEASE SEE CONSULT NOTE FROM 02/21/16  No complaints right now Wants some help with his lunch tray  Son not in the room at this time but I spoke with him yesterday and he confirmed the plan that the family/patient and Dr. Kathrene Bongo worked out after extensive discussion - there is no plan for him to undergo dialysis, and at end stage renal disease Hospice/Palliative Care was and remains the plan.   Objective Vital signs in last 24 hours: Filed Vitals:   02/21/16 1210 02/21/16 1333 02/21/16 1816 02/22/16 0657  BP: 150/72 132/60 137/77 160/76  Pulse: 86 84  78  Temp:  98.1 F (36.7 C)  98.1 F (36.7 C)  TempSrc:      Resp:  19  18  Height:      Weight:      SpO2: 100% 97%  100%   Weight change:   Intake/Output Summary (Last 24 hours) at 02/22/16 1331 Last data filed at 02/21/16 1856  Gross per 24 hour  Intake 394.17 ml  Output    370 ml  Net  24.17 ml    Physical Exam:  BP 160/76 mmHg  Pulse 78  Temp(Src) 98.1 F (36.7 C) (Oral)  Resp 18  Ht 5' 10.5" (1.791 m)  Wt 78.155 kg (172 lb 4.8 oz)  BMI 24.36 kg/m2  SpO2 100% Gen: 70 AAM, answers questions, pleasant, more alert than yesterday Neck: no JVD Chest: Clear Heart: Regular. S1S2 No S3 Abdomen: Protuberant, soft, minimal if any tenderness at this time Ext: No sig edema Neuro: Slow, but oriented to person/place   Labs:   Recent Labs Lab 02/20/16 1936 02/21/16 0915 02/22/16 0500  NA 140 140 141  K 5.3* 4.5 4.5  CL 111 112* 111  CO2 16* 17* 19*  GLUCOSE 194* 87 168*  BUN 95* 97* 84*  CREATININE 8.62* 8.18* 7.46*  CALCIUM 9.0 9.0 8.4*     Recent Labs Lab 02/20/16 1936 02/22/16 0500  AST 234* 204*  ALT 74* 84*  ALKPHOS 87 73  BILITOT 0.3 0.3  PROT 7.2 6.3*  ALBUMIN 3.4* 2.7*    Recent Labs Lab 02/20/16 1936  LIPASE 27     Recent Labs Lab 02/20/16 1936 02/22/16 0500  WBC 8.1 6.0  HGB 9.4* 9.1*  HCT 27.9* 27.6*  MCV 87.2 88.2  PLT 166 143*      Recent Labs Lab 02/21/16 1205 02/21/16 1706 02/21/16 2103 02/22/16 0739 02/22/16 1244  GLUCAP 93 170* 165* 138* 185*     Studies/Results: Ct Abdomen Pelvis Wo Contrast  02/21/2016  CLINICAL DATA:  Diffuse dull abdominal pain since yesterday. Dysuria. Renal insufficiency but patient refuses dialysis. EXAM: CT ABDOMEN AND PELVIS WITHOUT CONTRAST TECHNIQUE: Multidetector CT imaging of the abdomen and pelvis was performed following the standard protocol without IV contrast. COMPARISON:  05/16/2014 FINDINGS: Atelectasis in the lung bases. 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 The unenhanced appearance of the liver, spleen, pancreas, adrenal glands, kidneys, abdominal aorta, inferior vena cava, and retroperitoneal lymph nodes is unremarkable. Cholelithiasis. No inflammatory stranding around the gallbladder. No bile duct dilatation. Stomach, small bowel, and colon are not abnormally distended. No wall thickening is appreciated. Contrast material flows to the rectum without evidence of small bowel or colonic obstruction. No free air or free fluid in the abdomen. Pelvis: The appendix is normal. Bladder wall is not thickened. Prostate gland is not enlarged. No free or loculated pelvic fluid collections. No pelvic mass or  lymphadenopathy. Degenerative changes in the spine. No destructive bone lesions. 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 Cholelithiasis without changes to suggest cholecystitis. No evidence of bowel obstruction. Electronically Signed   By: Burman Nieves M.D.   On: 02/21/2016 05:14   US Renal  02/21/2016  CLINICAL DATA:  Worsening renal failure. History of hypertension, diabetes and chronic kidney disease. EXAM: RENAL / URINARY TRACT ULTRASOUND COMPLETE COMPARISON:  Renal ultrasound 06/09/2015.  Abdominal CT 02/21/2016. FINDINGS: Right Kidney: Length: 10.7 cm. The renal cortical echogenicity is increased. There is no significant cortical thinning,  focal lesion or hydronephrosis. Left Kidney: Length: 11.0 cm. The renal cortical echogenicity is increased. There is no significant cortical thinning, focal lesion or hydronephrosis. Bladder: Appears normal for degree of bladder distention. IMPRESSION: 1. Both kidneys are similar in size to prior studies without hydronephrosis. 2. Interval development of increased cortical echogenicity bilaterally, suggesting medical renal disease. No significant cortical thinning. Electronically Signed   By: Carey Bullocks M.D.   On: 02/21/2016 10:32   US Abdomen Limited Ruq  02/21/2016  CLINICAL DATA:  RIGHT upper quadrant pain since midnight, history hypertension, diabetes mellitus, GERD EXAM: US ABDOMEN LIMITED - RIGHT UPPER QUADRANT COMPARISON:  CT abdomen pelvis 02/21/2016 FINDINGS: Gallbladder: Shadowing gallstones in gallbladder up to 11 mm diameter. No gallbladder wall thickening, pericholecystic fluid or sonographic Murphy sign. Common bile duct: Diameter: 4 mm diameter , normal Liver: Normal appearance.  Hepatopetal portal venous flow. No RIGHT upper quadrant free fluid. IMPRESSION: Cholelithiasis without evidence acute cholecystitis. Electronically Signed   By: Ulyses Southward M.D.   On: 02/21/2016 10:51   Medications: . dextrose 5 % 1,000 mL with sodium bicarbonate 150 mEq infusion 50 mL/hr at 02/21/16 1815   . amLODipine  10 mg Oral Daily  . aspirin EC  81 mg Oral Daily  . benztropine  1 mg Oral BID  . docusate sodium  100 mg Oral BID  . haloperidol  10 mg Oral QHS  . haloperidol  5 mg Oral q morning - 10a  . heparin  5,000 Units Subcutaneous 3 times per day  . hydrALAZINE  25 mg Oral TID  . insulin aspart  0-9 Units Subcutaneous TID WC  . metoprolol  100 mg Oral BID  . milk and molasses  1 enema Rectal Once  . pantoprazole  40 mg Oral Daily  . patiromer  8.4 g Oral Daily  . polyethylene glycol  17 g Oral BID  . sodium chloride flush  3 mL Intravenous Q12H    Background 70 y.o. year-old male with  a background of DM, HTN, schizophrenia, GERD, HLD. He has known CKD - advanced - followed by Dr. Kathrene Bongo. He was last seen in the office 12/23/15 - he had made it clear to family and to Dr. Justice Deeds that he did not plan to ever undergo dialysis. He declined Kidney Options class, vascular access placement. He was using Veltassa for management of potassium, phenergan for nausea, and Dr. Kathrene Bongo had Palliative Care "on standby" if needed. Family all on board. He is admitted on the present occasion with abdominal pain and has had a history of RUQ pain off and on, though he cannot give a very good history. Has had prior cholecystitis but has declined surgery. Pain was mostly lower abdominal, felt possibly in part related to constipation. Transaminases were elevated. Getting workup. We were called because his creatinine was elevated at 8.62. K was 5.3. When he last saw Dr. Kathrene Bongo in January it  was 5.86. Primary team started IVF.   Impression/Recommendations  1. Advanced stage 5 CKD - Kidney function a little better today with IVF and no issues so far with volume overload.  I spoke with the patient's son yesterday who confirmed the plan that the family/patient and Dr. Kathrene BongoGoldsborough had worked out after extensive discussion - there is no plan for him to undergo dialysis, and at end stage renal disease Hospice/Palliative Care was and remains the desired path. I recommend being careful with aggressive IVF so as to avoid volume overload/CHF issues. He can continue Veltassa for hyperkalemia. If he were to continue to decline recommend moving quickly toward palliative care.   2. Abdominal pain - workup per primary. Looks brighter today, abd not particularly tender. Transaminases remain elevated. Per primary service. 3. Hyperkalemia - responded nicely to conservative Rx. Continue Veltassa 4. Metabolic acidosis - Changed fluids to isotonic bicarbat 50/hour yesterday. Bicarb up to 19.  Once  certain that he can consistently eat and drink could be stopped. 5. Anemia - no transfusion need 6. Schizophrenia - continue usual meds  At this time renal will sign off.  Plan for ESRD care clearly outlined in the consult note and above note. Please call if questions.  Camille Balynthia Ginia Rudell, MD [facility] [phone] pager 02/22/2016, 1:31 PM

## 2016-02-22 NOTE — Progress Notes (Signed)
Brief Note   Pt seen for MST score.   Pt reports a no recent weight loss.   Pt has a BMI of 24.4 which classifies as normal body weight.  Pt reports he consumes 3 meals daily PTA. His sister does the cooking for him. He reports eating items such as hamburger, fish, and chicken. Pt did not answer when asked if he follows a renal diet PTA, but reports he does not eat items such as white potatoes. Suspect he follows a renal diet to some degree. Pt requested bedtime, RD will order.   Conducted nutrition focused physical exam, identified no muscle or fat wasting. No edema present.   Pt is currently on a renal diet with 1200 mL of fluid restriction. He consumes approximately 80-100% of meals.  Medications reviewed. Labs reviewed.  No nutrition interventions warranted at this time. Please consult RD if nutritional needs arise.   Brynda RimBrittany Timara Loma, Dietetic Intern Pager: [phone]

## 2016-02-23 DIAGNOSIS — R109 Unspecified abdominal pain: Secondary | ICD-10-CM | POA: Diagnosis not present

## 2016-02-23 DIAGNOSIS — R740 Nonspecific elevation of levels of transaminase and lactic acid dehydrogenase [LDH]: Secondary | ICD-10-CM | POA: Diagnosis not present

## 2016-02-23 DIAGNOSIS — F209 Schizophrenia, unspecified: Secondary | ICD-10-CM | POA: Diagnosis not present

## 2016-02-23 DIAGNOSIS — N179 Acute kidney failure, unspecified: Secondary | ICD-10-CM | POA: Diagnosis not present

## 2016-02-23 LAB — COMPREHENSIVE METABOLIC PANEL
ALBUMIN: 2.5 g/dL — AB (ref 3.5–5.0)
ALT: 98 U/L — ABNORMAL HIGH (ref 17–63)
ANION GAP: 10 (ref 5–15)
AST: 156 U/L — ABNORMAL HIGH (ref 15–41)
Alkaline Phosphatase: 73 U/L (ref 38–126)
BILIRUBIN TOTAL: 0.5 mg/dL (ref 0.3–1.2)
BUN: 78 mg/dL — ABNORMAL HIGH (ref 6–20)
CO2: 24 mmol/L (ref 22–32)
Calcium: 8.2 mg/dL — ABNORMAL LOW (ref 8.9–10.3)
Chloride: 107 mmol/L (ref 101–111)
Creatinine, Ser: 6.46 mg/dL — ABNORMAL HIGH (ref 0.61–1.24)
GFR, EST AFRICAN AMERICAN: 9 mL/min — AB (ref >60)
GFR, EST NON AFRICAN AMERICAN: 8 mL/min — AB (ref >60)
Glucose, Bld: 148 mg/dL — ABNORMAL HIGH (ref 65–99)
POTASSIUM: 4 mmol/L (ref 3.5–5.1)
Sodium: 141 mmol/L (ref 135–145)
TOTAL PROTEIN: 6.2 g/dL — AB (ref 6.5–8.1)

## 2016-02-23 LAB — CBC
HEMATOCRIT: 24.5 % — AB (ref 39.0–52.0)
Hemoglobin: 8.3 g/dL — ABNORMAL LOW (ref 13.0–17.0)
MCH: 29.2 pg (ref 26.0–34.0)
MCHC: 33.9 g/dL (ref 30.0–36.0)
MCV: 86.3 fL (ref 78.0–100.0)
Platelets: 145 10*3/uL — ABNORMAL LOW (ref 150–400)
RBC: 2.84 MIL/uL — ABNORMAL LOW (ref 4.22–5.81)
RDW: 12.2 % (ref 11.5–15.5)
WBC: 6.2 10*3/uL (ref 4.0–10.5)

## 2016-02-23 LAB — GLUCOSE, CAPILLARY
GLUCOSE-CAPILLARY: 147 mg/dL — AB (ref 65–99)
GLUCOSE-CAPILLARY: 231 mg/dL — AB (ref 65–99)
Glucose-Capillary: 183 mg/dL — ABNORMAL HIGH (ref 65–99)
Glucose-Capillary: 97 mg/dL (ref 65–99)

## 2016-02-23 MED ORDER — SODIUM BICARBONATE 650 MG PO TABS
650.0000 mg | ORAL_TABLET | Freq: Three times a day (TID) | ORAL | Status: DC
Start: 2016-02-23 — End: 2016-02-24
  Administered 2016-02-23 – 2016-02-24 (×4): 650 mg via ORAL
  Filled 2016-02-23 (×4): qty 1

## 2016-02-23 NOTE — Progress Notes (Signed)
[facility] Rounding Note Subjective:   PLEASE SEE CONSULT NOTE FROM 02/21/16  Sitting up in the chair Appetite is good No nausea or vomiting  Son not in the room at this time but I spoke with him on 3/7 and he confirmed the plan that the family/patient and Dr. Kathrene BongoGoldsborough worked out after extensive discussion - there is no plan for him to undergo dialysis, and at end stage renal disease Hospice/Palliative Care was and remains the plan.   Objective Vital signs in last 24 hours: Filed Vitals:   02/22/16 1541 02/22/16 1947 02/23/16 0526 02/23/16 0851  BP: 127/55 125/62 145/61 165/82  Pulse: 97 94 84 77  Temp: 99.3 F (37.4 C) 99.9 F (37.7 C) 97.8 F (36.6 C)   TempSrc: Oral Oral Oral   Resp: 16 18 18 17   Height:      Weight:      SpO2: 100% 96% 100%    Weight change:   Intake/Output Summary (Last 24 hours) at 02/23/16 1014 Last data filed at 02/23/16 0427  Gross per 24 hour  Intake    720 ml  Output    300 ml  Net    420 ml    Physical Exam:  BP 165/82 mmHg  Pulse 77  Temp(Src) 97.8 F (36.6 C) (Oral)  Resp 17  Ht 5' 10.5" (1.791 m)  Wt 78.155 kg (172 lb 4.8 oz)  BMI 24.36 kg/m2  SpO2 100% Gen: Older AAM, answers questions, pleasant, progressively more alert Neck: no JVD Chest: Clear Heart: Regular. S1S2 No S3 Abdomen: Protuberant, soft, minimal if any tenderness at this time - he points to right upper and left lower abd - neither esp tender Ext: No sig edema Neuro: Slow, but oriented to person/place   Labs:   Recent Labs Lab 02/20/16 1936 02/21/16 0915 02/22/16 0500 02/23/16 0516  NA 140 140 141 141  K 5.3* 4.5 4.5 4.0  CL 111 112* 111 107  CO2 16* 17* 19* 24  GLUCOSE 194* 87 168* 148*  BUN 95* 97* 84* 78*  CREATININE 8.62* 8.18* 7.46* 6.46*  CALCIUM 9.0 9.0 8.4* 8.2*     Recent Labs Lab 02/20/16 1936 02/22/16 0500 02/23/16 0516  AST 234* 204* 156*  ALT 74* 84* 98*  ALKPHOS 87 73 73  BILITOT 0.3 0.3 0.5  PROT 7.2 6.3* 6.2*  ALBUMIN 3.4* 2.7*  2.5*    Recent Labs Lab 02/20/16 1936  LIPASE 27     Recent Labs Lab 02/20/16 1936 02/22/16 0500 02/23/16 0516  WBC 8.1 6.0 6.2  HGB 9.4* 9.1* 8.3*  HCT 27.9* 27.6* 24.5*  MCV 87.2 88.2 86.3  PLT 166 143* 145*     Recent Labs Lab 02/22/16 0739 02/22/16 1244 02/22/16 1721 02/22/16 2139 02/23/16 0800  GLUCAP 138* 185* 238* 142* 147*     Studies/Results: [place] Renal  02/21/2016  CLINICAL DATA:  Worsening renal failure. History of hypertension, diabetes and chronic kidney disease. EXAM: RENAL / URINARY TRACT ULTRASOUND COMPLETE COMPARISON:  Renal ultrasound 06/09/2015.  Abdominal CT 02/21/2016. FINDINGS: Right Kidney: Length: 10.7 cm. The renal cortical echogenicity is increased. There is no significant cortical thinning, focal lesion or hydronephrosis. Left Kidney: Length: 11.0 cm. The renal cortical echogenicity is increased. There is no significant cortical thinning, focal lesion or hydronephrosis. Bladder: Appears normal for degree of bladder distention. IMPRESSION: 1. Both kidneys are similar in size to prior studies without hydronephrosis. 2. Interval development of increased cortical echogenicity bilaterally, suggesting medical renal disease. No significant cortical  thinning. Electronically Signed   By: Carey Bullocks M.D.   On: 02/21/2016 10:32   US Abdomen Limited Ruq  02/21/2016  CLINICAL DATA:  RIGHT upper quadrant pain since midnight, history hypertension, diabetes mellitus, GERD EXAM: US ABDOMEN LIMITED - RIGHT UPPER QUADRANT COMPARISON:  CT abdomen pelvis 02/21/2016 FINDINGS: Gallbladder: Shadowing gallstones in gallbladder up to 11 mm diameter. No gallbladder wall thickening, pericholecystic fluid or sonographic Murphy sign. Common bile duct: Diameter: 4 mm diameter , normal Liver: Normal appearance.  Hepatopetal portal venous flow. No RIGHT upper quadrant free fluid. IMPRESSION: Cholelithiasis without evidence acute cholecystitis. Electronically Signed   By: Ulyses Southward  M.D.   On: 02/21/2016 10:51   Medications: . dextrose 5 % 1,000 mL with sodium bicarbonate 150 mEq infusion 50 mL/hr at 02/22/16 1503   . amLODipine  10 mg Oral Daily  . aspirin EC  81 mg Oral Daily  . benztropine  1 mg Oral BID  . docusate sodium  100 mg Oral BID  . haloperidol  10 mg Oral QHS  . haloperidol  5 mg Oral q morning - 10a  . heparin  5,000 Units Subcutaneous 3 times per day  . hydrALAZINE  25 mg Oral TID  . insulin aspart  0-9 Units Subcutaneous TID WC  . metoprolol  100 mg Oral BID  . milk and molasses  1 enema Rectal Once  . pantoprazole  40 mg Oral Daily  . patiromer  8.4 g Oral Daily  . polyethylene glycol  17 g Oral BID  . sodium chloride flush  3 mL Intravenous Q12H    Background 70 y.o. year-old male with a background of DM, HTN, schizophrenia, GERD, HLD. He has known CKD - advanced - followed by Dr. Kathrene Bongo. He was last seen in the office 12/23/15 - he had made it clear to family and to Dr. Justice Deeds that he did not plan to ever undergo dialysis. He declined Kidney Options class, vascular access placement. He was using Veltassa for management of potassium, phenergan for nausea, and Dr. Kathrene Bongo had Palliative Care "on standby" if needed. Family all on board. He is admitted on the present occasion with abdominal pain and has had a history of RUQ pain off and on, though he cannot give a very good history. Has had prior cholecystitis but has declined surgery. Pain was mostly lower abdominal, felt possibly in part related to constipation. Transaminases were elevated. Getting workup. We were called because his creatinine was elevated at 8.62. K was 5.3. When he last saw Dr. Kathrene Bongo in January it was 5.86. Primary team started IVF.   Impression/Recommendations  1. Advanced stage 5 CKD - Kidney function a little better today with IVF and no issues so far with volume overload. Much brighter neurologically.  I spoke with the patient's son on 3/7 who  confirmed the plan that the family/patient and Dr. Kathrene Bongo had worked out after extensive discussion - there is no plan for him to undergo dialysis, and at end stage renal disease Hospice/Palliative Care was and remains the desired path. I recommend being careful with aggressive IVF so as to avoid volume overload/CHF issues. He can continue Veltassa for hyperkalemia. If he were to continue to decline recommend moving quickly toward palliative care.   2. Abdominal pain - workup per primary. Looks brighter today, abd not particularly tender. Transaminases remain elevated. Per primary service. 3. Hyperkalemia - responded nicely to conservative Rx. Continue Veltassa 4. Metabolic acidosis - Changed fluids to isotonic bicarbat 50/hour yesterday.  Bicarb up to 26.  Eating and drinking consistently. I think IVF could be stopped. Add oral bicarb. 5. Anemia - no transfusion need. Can check Fe and replete if low. Don't think expense of ESA's warranted. 6. Schizophrenia - continue usual meds  At this time renal will sign off (planned to yesterday but wanted to give 1 more day to tweak what we could.  Plan for ESRD care clearly outlined in the consult note and above note. Please call if questions.  Camille Bal, MD [facility] [phone] pager 02/23/2016, 10:14 AM

## 2016-02-23 NOTE — Progress Notes (Signed)
[facility] PROGRESS NOTE  [name] [name] ZOX:[id number] DOB: 11-30-1946 DOA: 02/21/2016  PCP: Aura Dials, MD  Brief HPI: 70 year old African-American male with past medical history of chronic kidney disease stage V, who has refused dialysis on multiple occasions, history of diabetes, hypertension, presented with complaints of abdominal pain. He was found to have acute renal failure. Patient was hospitalized for further management.  Past medical history:  Past Medical History  Diagnosis Date  . Diabetes mellitus without complication (HCC)   . Hypertension   . GERD (gastroesophageal reflux disease)   . Hyperlipidemia   . Chronic kidney disease     kidney stones    Consultants: Nephrology  Procedures: None  Antibiotics: None  Subjective: Patient is not very communicative due to his underlying mental health illness. History is very limited.  Objective: Vital Signs  Filed Vitals:   02/22/16 1541 02/22/16 1947 02/23/16 0526 02/23/16 0851  BP: 127/55 125/62 145/61 165/82  Pulse: 97 94 84 77  Temp: 99.3 F (37.4 C) 99.9 F (37.7 C) 97.8 F (36.6 C)   TempSrc: Oral Oral Oral   Resp: Height:      Weight:      SpO2: 100% 96% 100%     Intake/Output Summary (Last 24 hours) at 02/23/16 0925 Last data filed at 02/23/16 0427  Gross per 24 hour  Intake    720 ml  Output    300 ml  Net    420 ml   Filed Weights   02/21/16 0912  Weight: 78.155 kg (172 lb 4.8 oz)    General appearance: alert, appears stated age, distracted and no distress Resp: clear to auscultation bilaterally Cardio: regular rate and rhythm, S1, S2 normal, no murmur, click, rub or gallop GI: soft, non-tender; bowel sounds normal; no masses,  no organomegaly Extremities: extremities normal, atraumatic, no cyanosis or edema Neurologic: Awake and alert. Distracted. No obvious focal neurological deficits.  Lab Results:  Basic Metabolic Panel:  Recent Labs Lab 02/20/16 1936  02/21/16 0915 02/22/16 0500 02/23/16 0516  NA 140 140 141 141  K 5.3* 4.5 4.5 4.0  CL 111 112* 111 107  CO2 16* 17* 19* 24  GLUCOSE 194* 87 168* 148*  BUN 95* 97* 84* 78*  CREATININE 8.62* 8.18* 7.46* 6.46*  CALCIUM 9.0 9.0 8.4* 8.2*   Liver Function Tests:  Recent Labs Lab 02/20/16 1936 02/22/16 0500 02/23/16 0516  AST 234* 204* 156*  ALT 74* 84* 98*  ALKPHOS 87 73 73  BILITOT 0.3 0.3 0.5  PROT 7.2 6.3* 6.2*  ALBUMIN 3.4* 2.7* 2.5*    Recent Labs Lab 02/20/16 1936  LIPASE 27   CBC:  Recent Labs Lab 02/20/16 1936 02/22/16 0500 02/23/16 0516  WBC 8.1 6.0 6.2  HGB 9.4* 9.1* 8.3*  HCT 27.9* 27.6* 24.5*  MCV 87.2 88.2 86.3  PLT 166 143* 145*   CBG:  Recent Labs Lab 02/22/16 0739 02/22/16 1244 02/22/16 1721 02/22/16 2139 02/23/16 0800  GLUCAP 138* 185* 238* 142* 147*    No results found for this or any previous visit (from the past 240 hour(s)).    Studies/Results: US Renal  02/21/2016  CLINICAL DATA:  Worsening renal failure. History of hypertension, diabetes and chronic kidney disease. EXAM: RENAL / URINARY TRACT ULTRASOUND COMPLETE COMPARISON:  Renal ultrasound 06/09/2015.  Abdominal CT 02/21/2016. FINDINGS: Right Kidney: Length: 10.7 cm. The renal cortical echogenicity is increased. There is no significant cortical thinning, focal lesion or hydronephrosis. Left Kidney:  Length: 11.0 cm. The renal cortical echogenicity is increased. There is no significant cortical thinning, focal lesion or hydronephrosis. Bladder: Appears normal for degree of bladder distention. IMPRESSION: 1. Both kidneys are similar in size to prior studies without hydronephrosis. 2. Interval development of increased cortical echogenicity bilaterally, suggesting medical renal disease. No significant cortical thinning. Electronically Signed   By: Carey BullocksWilliam  Veazey M.D.   On: 02/21/2016 10:32   [place] Abdomen Limited Ruq  02/21/2016  CLINICAL DATA:  RIGHT upper quadrant pain since midnight,  history hypertension, diabetes mellitus, GERD EXAM: US ABDOMEN LIMITED - RIGHT UPPER QUADRANT COMPARISON:  CT abdomen pelvis 02/21/2016 FINDINGS: Gallbladder: Shadowing gallstones in gallbladder up to 11 mm diameter. No gallbladder wall thickening, pericholecystic fluid or sonographic Murphy sign. Common bile duct: Diameter: 4 mm diameter , normal Liver: Normal appearance.  Hepatopetal portal venous flow. No RIGHT upper quadrant free fluid. IMPRESSION: Cholelithiasis without evidence acute cholecystitis. Electronically Signed   By: Ulyses SouthwardMark  Boles M.D.   On: 02/21/2016 10:51    Medications:  Scheduled: . amLODipine  10 mg Oral Daily  . aspirin EC  81 mg Oral Daily  . benztropine  1 mg Oral BID  . docusate sodium  100 mg Oral BID  . haloperidol  10 mg Oral QHS  . haloperidol  5 mg Oral q morning - 10a  . heparin  5,000 Units Subcutaneous 3 times per day  . hydrALAZINE  25 mg Oral TID  . insulin aspart  0-9 Units Subcutaneous TID WC  . metoprolol  100 mg Oral BID  . milk and molasses  1 enema Rectal Once  . pantoprazole  40 mg Oral Daily  . patiromer  8.4 g Oral Daily  . polyethylene glycol  17 g Oral BID  . sodium chloride flush  3 mL Intravenous Q12H   Continuous: . dextrose 5 % 1,000 mL with sodium bicarbonate 150 mEq infusion 50 mL/hr at 02/22/16 1503   ZOX:[id number]:acetaminophen **OR** acetaminophen, bisacodyl, HYDROcodone-acetaminophen, morphine injection, ondansetron **OR** ondansetron (ZOFRAN) IV  Assessment/Plan:  Active Problems:   Diabetes (HCC)   Hypertension   Hyperkalemia   Schizophrenia (HCC)   Acute on chronic renal failure (HCC)   Chronic abdominal pain   Transaminitis   Abdominal pain   Anemia in chronic kidney disease   Diabetes mellitus with complication (HCC)    Chronic, episodic abdominal pain Patient has been told in the past that he needed a cholecystectomy. He does havecholelithiasis but pain is diffuse making it atypical for gallbladder. No significant RUQ  tenderness. Patient is a poor historian. Non-contrast CTscan negative for any acute finding. Right upper quadrant ultrasound negative for cholecystitis. Common bile duct was normal size. Tolerating his meals. Continue stool softeners and laxatives as constipation could be causing some of his symptoms.  Transaminitis Pattern of elevation suggests ETOH though patient denies ETOH use. Could have passed a gallstone. Repeat LFTs are improving. Continue to monitor for now. Ultrasound does not show any acute findings in the hepatic biliary system.  Acute on chronic renal failure with metabolic acidosis Doesn't want dialysis. Patient seen by nephrology as well. Continue gentle IV hydration for now. No hydronephrosis noted on ultrasound. Creatinine appears to be improving slowly. According to nephrology notes, creatinine was 5.86 in January.  Hyperkalemia On Veltassa at home. Potassium level has improved. Patient was given insulin, dextrose, kayelxalate in ED.   Essential Hypertension Continue home hydralazine, Lopressor, Norvasc  Diabetes, presumably type 2.  Holding home levimir insulin. CBGs and SSI. HbA1c  is 7.1.  Chronic anemia, normocytic Stable. Likely secondary to chronic kidney disease.   Hyperlipidemia.  Holding statin given elevated LFTs.  GERD.  Continue home PPI  History of nephrolithiasis, obstructing. Not an active issue currently.  Significant psychiatric history including schizophrenia / Depression Continue Cogentin, Haldol  Hx of multiple TIAs Continue home asa.   DVT Prophylaxis: Subcutaneous heparin    Code Status: DO NOT RESUSCITATE  Family Communication: No family at bedside. Tried calling son without success. Disposition Plan: Await further improvement in renal function. Mobilize. Anticipate discharge tomorrow.    LOS: 2 days   [facility]  [facility] Pager [phone] 02/23/2016, 9:25 AM  If 7PM-7AM, please contact night-coverage at  [email or web address], password [facility]

## 2016-02-23 NOTE — Evaluation (Signed)
Physical Therapy Evaluation Patient Details Name: [name] [name] MRN: [id number] DOB: 1946/04/29 Today's Date: 02/23/2016   History of Present Illness  Pt adm with acute on chronic renal failure. Pt has chosen not to pursue HD. PMH - schizophrenia, DM, CKD,HTN  Clinical Impression  Pt admitted with above diagnosis and presents to PT with functional limitations due to deficits listed below (See PT problem list). Pt needs skilled PT to maximize independence and safety to allow discharge to home with some family support. Doubt pt is far from baseline but no one present to confirm baseline.     Follow Up Recommendations Home health PT (if this is different from baseline)    Equipment Recommendations  None recommended by PT    Recommendations for Other Services       Precautions / Restrictions Precautions Precautions: Fall Restrictions Weight Bearing Restrictions: No      Mobility  Bed Mobility Overal bed mobility: Needs Assistance Bed Mobility: Supine to Sit;Sit to Supine     Supine to sit: Min assist Sit to supine: Supervision   General bed mobility comments: Assist to elevate trunk  Transfers Overall transfer level: Needs assistance Equipment used: Rolling walker (2 wheeled) Transfers: Sit to/from Stand Sit to Stand: Min guard         General transfer comment: Assist for safety  Ambulation/Gait Ambulation/Gait assistance: Min guard Ambulation Distance (Feet): 25 Feet Assistive device: Rolling walker (2 wheeled) Gait Pattern/deviations: Step-through pattern;Decreased step length - right;Decreased step length - left;Shuffle Gait velocity: decr Gait velocity interpretation: Below normal speed for age/gender General Gait Details: No loss of balance but very slow  Stairs            Wheelchair Mobility    Modified Rankin (Stroke Patients Only)       Balance Overall balance assessment: Needs assistance Sitting-balance support: No upper extremity  supported;Feet supported Sitting balance-Leahy Scale: Good     Standing balance support: No upper extremity supported Standing balance-Leahy Scale: Fair                               Pertinent Vitals/Pain Pain Assessment: No/denies pain    Home Living Family/patient expects to be discharged to:: Private residence   Available Help at Discharge: Family;Available PRN/intermittently Type of Home: House         Home Equipment: Walker - 2 wheels Additional Comments: Difficult to assess accuracy from pt    Prior Function Level of Independence: Needs assistance   Gait / Transfers Assistance Needed: Unsure of accuracy. Pt agrees he amb with walker           Hand Dominance        Extremity/Trunk Assessment   Upper Extremity Assessment: Generalized weakness           Lower Extremity Assessment: Generalized weakness         Communication      Cognition Arousal/Alertness: Awake/alert Behavior During Therapy: WFL for tasks assessed/performed Overall Cognitive Status: No family/caregiver present to determine baseline cognitive functioning Area of Impairment: Problem solving;Following commands       Following Commands: Follows one step commands consistently;Follows multi-step commands with increased time     Problem Solving: Slow processing;Requires verbal cues      General Comments      Exercises        Assessment/Plan    PT Assessment Patient needs continued PT services  PT Diagnosis Difficulty walking;Generalized weakness  PT Problem List Decreased strength;Decreased activity tolerance;Decreased balance;Decreased mobility  PT Treatment Interventions DME instruction;Gait training;Functional mobility training;Therapeutic activities;Therapeutic exercise;Balance training;Patient/family education   PT Goals (Current goals can be found in the Care Plan section) Acute Rehab PT Goals Patient Stated Goal: not stated PT Goal Formulation: With  patient Time For Goal Achievement: 03/01/16 Potential to Achieve Goals: Good    Frequency Min 3X/week   Barriers to discharge        Co-evaluation               End of Session Equipment Utilized During Treatment: Gait belt Activity Tolerance: Patient tolerated treatment well Patient left: in bed;with call bell/phone within reach;with bed alarm set      Functional Assessment Tool Used: clinical judgement Functional Limitation: Mobility: Walking and moving around Mobility: Walking and Moving Around Current Status ([id number]): At least 20 percent but less than 40 percent impaired, limited or restricted Mobility: Walking and Moving Around Goal Status [phone]): At least 1 percent but less than 20 percent impaired, limited or restricted    Time: 1725-1735 PT Time Calculation (min) (ACUTE ONLY): 10 min   Charges:   PT Evaluation $PT Eval Moderate Complexity: 1 Procedure     PT G Codes:   PT G-Codes **NOT FOR INPATIENT CLASS** Functional Assessment Tool Used: clinical judgement Functional Limitation: Mobility: Walking and moving around Mobility: Walking and Moving Around Current Status ([id number]([id number]): At least 20 percent but less than 40 percent impaired, limited or restricted Mobility: Walking and Moving Around Goal Status [phone]): At least 1 percent but less than 20 percent impaired, limited or restricted    [facility] CenterMAYCOCK,Cooper Stamp 02/23/2016, 6:15 PM [facility] Lisel Siegrist PT [phone]

## 2016-02-24 DIAGNOSIS — N179 Acute kidney failure, unspecified: Secondary | ICD-10-CM | POA: Diagnosis not present

## 2016-02-24 DIAGNOSIS — N189 Chronic kidney disease, unspecified: Secondary | ICD-10-CM | POA: Diagnosis not present

## 2016-02-24 LAB — RENAL FUNCTION PANEL
ALBUMIN: 2.7 g/dL — AB (ref 3.5–5.0)
Anion gap: 12 (ref 5–15)
BUN: 68 mg/dL — ABNORMAL HIGH (ref 6–20)
CALCIUM: 8.4 mg/dL — AB (ref 8.9–10.3)
CO2: 23 mmol/L (ref 22–32)
CREATININE: 6.17 mg/dL — AB (ref 0.61–1.24)
Chloride: 104 mmol/L (ref 101–111)
GFR, EST AFRICAN AMERICAN: 10 mL/min — AB (ref >60)
GFR, EST NON AFRICAN AMERICAN: 8 mL/min — AB (ref >60)
Glucose, Bld: 152 mg/dL — ABNORMAL HIGH (ref 65–99)
PHOSPHORUS: 3.4 mg/dL (ref 2.5–4.6)
Potassium: 4.2 mmol/L (ref 3.5–5.1)
SODIUM: 139 mmol/L (ref 135–145)

## 2016-02-24 LAB — CBC
HCT: 27.3 % — ABNORMAL LOW (ref 39.0–52.0)
Hemoglobin: 8.7 g/dL — ABNORMAL LOW (ref 13.0–17.0)
MCH: 27.6 pg (ref 26.0–34.0)
MCHC: 31.9 g/dL (ref 30.0–36.0)
MCV: 86.7 fL (ref 78.0–100.0)
PLATELETS: 153 10*3/uL (ref 150–400)
RBC: 3.15 MIL/uL — AB (ref 4.22–5.81)
RDW: 12.3 % (ref 11.5–15.5)
WBC: 5.8 10*3/uL (ref 4.0–10.5)

## 2016-02-24 LAB — GLUCOSE, CAPILLARY
Glucose-Capillary: 149 mg/dL — ABNORMAL HIGH (ref 65–99)
Glucose-Capillary: 187 mg/dL — ABNORMAL HIGH (ref 65–99)

## 2016-02-24 LAB — IRON AND TIBC
IRON: 35 ug/dL — AB (ref 45–182)
Saturation Ratios: 20 % (ref 17.9–39.5)
TIBC: 175 ug/dL — AB (ref 250–450)
UIBC: 140 ug/dL

## 2016-02-24 MED ORDER — INSULIN DETEMIR 100 UNIT/ML ~~LOC~~ SOLN: 5.0000 [IU] | Freq: Every day | SUBCUTANEOUS | Status: DC

## 2016-02-24 MED ORDER — HYDROCODONE-ACETAMINOPHEN 5-325 MG PO TABS: 1.0000 | ORAL_TABLET | Freq: Four times a day (QID) | ORAL | Status: AC | PRN

## 2016-02-24 MED ORDER — SODIUM BICARBONATE 650 MG PO TABS: 650.0000 mg | ORAL_TABLET | Freq: Three times a day (TID) | ORAL | Status: DC

## 2016-02-24 MED ORDER — POLYETHYLENE GLYCOL 3350 17 G PO PACK: 17.0000 g | PACK | Freq: Every day | ORAL | Status: AC

## 2016-02-24 MED ORDER — DOCUSATE SODIUM 100 MG PO CAPS: 100.0000 mg | ORAL_CAPSULE | Freq: Two times a day (BID) | ORAL | Status: AC

## 2016-02-24 NOTE — Discharge Summary (Signed)
[facility]  Physician Discharge Summary   Patient ID: [name] [name] MRN: [id number] DOB/AGE: 1946/09/05 70 y.o.  Admit date: 02/21/2016 Discharge date: 02/24/2016  PCP: Aura Dials, MD  DISCHARGE DIAGNOSES:  Active Problems:   Diabetes (HCC)   Hypertension   Hyperkalemia   Schizophrenia (HCC)   Acute on chronic renal failure (HCC)   Chronic abdominal pain   Transaminitis   Abdominal pain   Anemia in chronic kidney disease   Diabetes mellitus with complication (HCC)   RECOMMENDATIONS FOR OUTPATIENT FOLLOW UP: 1. Patient's Pravachol has been held due to abnormal LFT's 2. Please check his LFTs at follow-up and resume statin as indicated   DISCHARGE CONDITION: fair  Diet recommendation: Modified carbohydrate  Filed Weights   02/21/16 0912  Weight: 78.155 kg (172 lb 4.8 oz)    INITIAL HISTORY: 70 year old African-American male with past medical history of chronic kidney disease stage V, who has refused dialysis on multiple occasions, history of diabetes, hypertension, presented with complaints of abdominal pain. He was found to have acute renal failure. Patient was hospitalized for further management.  Consultations:  Nephrology   HOSPITAL COURSE:   Chronic, episodic abdominal pain Patient has been told in the past that he needed a cholecystectomy, but he has refused. He does havecholelithiasis but pain is diffuse making it atypical for gallbladder. No significant RUQ tenderness. Patient is a poor historian. Non-contrast CTscan negative for any acute finding. Right upper quadrant ultrasound negative for cholecystitis. Common bile duct was normal size. Tolerating his meals. Continue stool softeners and laxatives as constipation could be causing some of his symptoms.  Transaminitis Pattern of elevation suggests ETOH though patient denies ETOH use. Could have passed a gallstone. Repeat LFTs are improving. Continue to monitor for now. Ultrasound does not  show any acute findings in the hepatic biliary system. Statin has been held. LFTs should be repeated and outpatient follow-up.  Acute on chronic renal failure with metabolic acidosis Doesn't want dialysis. Patient seen by nephrology as well. Patient's renal function improved with IV hydration. He was also placed on bicarbonate. No hydronephrosis noted on ultrasound. According to nephrology notes, creatinine was 5.86 in January. Close to his baseline today. Patient is making urine. He is able to take orally without difficulty. IV fluids were discontinued by nephrology yesterday.  Hyperkalemia On [place] at home. Potassium level has improved. Patient was given insulin, dextrose, kayelxalate in ED.   Essential Hypertension Continue home hydralazine, Lopressor, Norvasc  Diabetes, presumably type 2.  Holding home levimir insulin. CBGs and SSI. HbA1c is 7.1.  Chronic anemia, normocytic Stable. Likely secondary to chronic kidney disease.   Hyperlipidemia.  Holding statin given elevated LFTs.  GERD.  Continue home PPI  History of nephrolithiasis, obstructing. Not an active issue currently.  Significant psychiatric history including schizophrenia / Depression Continue Cogentin, Haldol  Hx of multiple TIAs Continue home asa.  Overall, stable. Continues to complain of occasional abdominal pain. Patient's son was reassured. Okay for discharge today. Home health has been ordered.  PERTINENT LABS:  The results of significant diagnostics from this hospitalization (including imaging, microbiology, ancillary and laboratory) are listed below for reference.     Labs: Basic Metabolic Panel:  Recent Labs Lab 02/20/16 1936 02/21/16 0915 02/22/16 0500 02/23/16 0516 02/24/16 0559  NA 140 140 141 141 139  K 5.3* 4.5 4.5 4.0 4.2  CL 111 112* 111 107 104  CO2 16* 17* 19* 24 23  GLUCOSE 194* 87 168* 148* 152*  BUN 95* 97*  84* 78* 68*  CREATININE 8.62* 8.18* 7.46* 6.46* 6.17*  CALCIUM  9.0 9.0 8.4* 8.2* 8.4*  PHOS  --   --   --   --  3.4   Liver Function Tests:  Recent Labs Lab 02/20/16 1936 02/22/16 0500 02/23/16 0516 02/24/16 0559  AST 234* 204* 156*  --   ALT 74* 84* 98*  --   ALKPHOS 87 73 73  --   BILITOT 0.3 0.3 0.5  --   PROT 7.2 6.3* 6.2*  --   ALBUMIN 3.4* 2.7* 2.5* 2.7*    Recent Labs Lab 02/20/16 1936  LIPASE 27   CBC:  Recent Labs Lab 02/20/16 1936 02/22/16 0500 02/23/16 0516 02/24/16 0549  WBC 8.1 6.0 6.2 5.8  HGB 9.4* 9.1* 8.3* 8.7*  HCT 27.9* 27.6* 24.5* 27.3*  MCV 87.2 88.2 86.3 86.7  PLT 166 143* 145* 153   CBG:  Recent Labs Lab 02/23/16 1211 02/23/16 1800 02/23/16 2241 02/24/16 0748 02/24/16 1233  GLUCAP 231* 183* 97 149* 187*     IMAGING STUDIES Ct Abdomen Pelvis Wo Contrast  02/21/2016  CLINICAL DATA:  Diffuse dull abdominal pain since yesterday. Dysuria. Renal insufficiency but patient refuses dialysis. EXAM: CT ABDOMEN AND PELVIS WITHOUT CONTRAST TECHNIQUE: Multidetector CT imaging of the abdomen and pelvis was performed following the standard protocol without IV contrast. COMPARISON:  05/16/2014 FINDINGS: Atelectasis in the lung bases. 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 The unenhanced appearance of the liver, spleen, pancreas, adrenal glands, kidneys, abdominal aorta, inferior vena cava, and retroperitoneal lymph nodes is unremarkable. Cholelithiasis. No inflammatory stranding around the gallbladder. No bile duct dilatation. Stomach, small bowel, and colon are not abnormally distended. No wall thickening is appreciated. Contrast material flows to the rectum without evidence of small bowel or colonic obstruction. No free air or free fluid in the abdomen. Pelvis: The appendix is normal. Bladder wall is not thickened. Prostate gland is not enlarged. No free or loculated pelvic fluid collections. No pelvic mass or lymphadenopathy. Degenerative changes in the spine. No destructive bone lesions. 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 Cholelithiasis without changes to suggest cholecystitis. No evidence of bowel obstruction. Electronically Signed   By: Burman Nieves M.D.   On: 02/21/2016 05:14   US Renal  02/21/2016  CLINICAL DATA:  Worsening renal failure. History of hypertension, diabetes and chronic kidney disease. EXAM: RENAL / URINARY TRACT ULTRASOUND COMPLETE COMPARISON:  Renal ultrasound 06/09/2015.  Abdominal CT 02/21/2016. FINDINGS: Right Kidney: Length: 10.7 cm. The renal cortical echogenicity is increased. There is no significant cortical thinning, focal lesion or hydronephrosis. Left Kidney: Length: 11.0 cm. The renal cortical echogenicity is increased. There is no significant cortical thinning, focal lesion or hydronephrosis. Bladder: Appears normal for degree of bladder distention. IMPRESSION: 1. Both kidneys are similar in size to prior studies without hydronephrosis. 2. Interval development of increased cortical echogenicity bilaterally, suggesting medical renal disease. No significant cortical thinning. Electronically Signed   By: Carey Bullocks M.D.   On: 02/21/2016 10:32   US Abdomen Limited Ruq  02/21/2016  CLINICAL DATA:  RIGHT upper quadrant pain since midnight, history hypertension, diabetes mellitus, GERD EXAM: US ABDOMEN LIMITED - RIGHT UPPER QUADRANT COMPARISON:  CT abdomen pelvis 02/21/2016 FINDINGS: Gallbladder: Shadowing gallstones in gallbladder up to 11 mm diameter. No gallbladder wall thickening, pericholecystic fluid or sonographic Murphy sign. Common bile duct: Diameter: 4 mm diameter , normal Liver: Normal appearance.  Hepatopetal portal venous flow. No RIGHT upper quadrant free  fluid. IMPRESSION: Cholelithiasis without evidence acute cholecystitis. Electronically Signed   By: Ulyses Southward M.D.   On: 02/21/2016 10:51    DISCHARGE EXAMINATION: Filed Vitals:   02/23/16 2235 02/24/16 0409 02/24/16 0420 02/24/16 0804  BP:  143/86 143/70 147/85  Pulse:  95 87 85 89  Temp: 98.1 F (36.7 C) 99.2 F (37.3 C) 99 F (37.2 C)   TempSrc: Oral Oral Oral   Resp: Height:      Weight:      SpO2: 98% 98% 99%    General appearance: alert, distracted and no distress Resp: clear to auscultation bilaterally Cardio: regular rate and rhythm, S1, S2 normal, no murmur, click, rub or gallop GI: Nontender today. Bowel sounds are present. No masses or organomegaly. Extremities: extremities normal, atraumatic, no cyanosis or edema  DISPOSITION: Home with home health  Discharge Instructions    Call MD for:  difficulty breathing, headache or visual disturbances    Complete by:  As directed      Call MD for:  extreme fatigue    Complete by:  As directed      Call MD for:  persistant dizziness or light-headedness    Complete by:  As directed      Call MD for:  persistant nausea and vomiting    Complete by:  As directed      Call MD for:  severe uncontrolled pain    Complete by:  As directed      Call MD for:  temperature >100.4    Complete by:  As directed      Diet Carb Modified    Complete by:  As directed      Discharge instructions    Complete by:  As directed   Please follow up with your PCP for blood work in 1 week.  You were cared for by a hospitalist during your hospital stay. If you have any questions about your discharge medications or the care you received while you were in the hospital after you are discharged, you can call the unit and asked to speak with the hospitalist on call if the hospitalist that took care of you is not available. Once you are discharged, your primary care physician will handle any further medical issues. Please note that NO REFILLS for any discharge medications will be authorized once you are discharged, as it is imperative that you return to your primary care physician (or establish a relationship with a primary care physician if you do not have one) for your aftercare needs so that they can reassess your  need for medications and monitor your lab values. If you do not have a primary care physician, you can call [phone] for a physician referral.     Increase activity slowly    Complete by:  As directed            ALLERGIES:  Allergies  Allergen Reactions  . Bee Venom Anaphylaxis     Discharge Medication List as of 02/24/2016  2:34 PM    START taking these medications   Details  HYDROcodone-acetaminophen (NORCO/VICODIN) 5-325 MG tablet Take 1 tablet by mouth every 6 (six) hours as needed for moderate pain., Starting 02/24/2016, Until Discontinued, Print    polyethylene glycol (MIRALAX / GLYCOLAX) packet Take 17 g by mouth daily., Starting 02/24/2016, Until Discontinued, Print    sodium bicarbonate 650 MG tablet Take 1 tablet (650 mg total) by mouth 3 (three) times daily., Starting 02/24/2016, Until  Discontinued, Print      CONTINUE these medications which have CHANGED   Details  docusate sodium (COLACE) 100 MG capsule Take 1 capsule (100 mg total) by mouth 2 (two) times daily., Starting 02/24/2016, Until Discontinued, No Print    insulin detemir (LEVEMIR) 100 UNIT/ML injection Inject 0.05 mLs (5 Units total) into the skin daily., Starting 02/24/2016, Until Discontinued, No Print      CONTINUE these medications which have NOT CHANGED   Details  amLODipine (NORVASC) 10 MG tablet Take 10 mg by mouth daily., Until Discontinued, Historical Med    aspirin 81 MG tablet Take 81 mg by mouth daily., Until Discontinued, Historical Med    benztropine (COGENTIN) 1 MG tablet Take 1 tablet (1 mg total) by mouth 2 (two) times daily., Starting 01/04/2016, Until Discontinued, Normal    clotrimazole (LOTRIMIN) 1 % cream Apply 1 application topically 2 (two) times daily as needed (rash)., Until Discontinued, Historical Med    esomeprazole (NEXIUM) 20 MG capsule Take 20 mg by mouth daily. , Until Discontinued, Historical Med    haloperidol (HALDOL) 5 MG tablet Take1 tab in am, then take 2 tabs in pm,  Normal    hydrALAZINE (APRESOLINE) 25 MG tablet Take 1 tablet (25 mg total) by mouth 3 (three) times daily., Starting 06/11/2015, Until Discontinued, No Print    metoprolol (LOPRESSOR) 100 MG tablet Take 100 mg by mouth 2 (two) times daily., Until Discontinued, Historical Med    patiromer (VELTASSA) 8.4 g packet Take 8.4 g by mouth daily., Until Discontinued, Historical Med    promethazine (PHENERGAN) 25 MG suppository Place 25 mg rectally every 6 (six) hours as needed for nausea or vomiting., Until Discontinued, Historical Med    promethazine (PHENERGAN) 25 MG tablet Take 25 mg by mouth every 6 (six) hours as needed for nausea or vomiting., Until Discontinued, Historical Med      STOP taking these medications     pravastatin (PRAVACHOL) 40 MG tablet        Follow-up Information    Follow up with BOUSKA,DAVID E, MD. Schedule an appointment as soon as possible for a visit in 1 week.   Specialty:  Family Medicine   Why:  for blood work to check your kidneys and for post hospitalization follow up   Contact information:   [address] [place] [place] [address] [phone]       Follow up with [facility].   Why:  Arranged home health PT   Contact information:   [address] [place] [address] [phone]       TOTAL DISCHARGE TIME: 35 minutes  [facility]RISHNAN,[place] Sardo  [facility] Pager [phone]  02/24/2016, 3:34 PM

## 2016-02-24 NOTE — Progress Notes (Signed)
NURSING PROGRESS NOTE  [name] [name] Luu [id number] Discharge Data: 02/24/2016 3:18 PM Attending Provider: Osvaldo ShipperGokul Krishnan, MD WUJ:WJXBJY,NWGNFPCP:BOUSKA,DAVID E, MD     [name] [name] [name] to be D/C'd Home per MD order.  Discussed with the patient the After Visit Summary and all questions fully answered. All IV's discontinued with no bleeding noted. All belongings returned to patient for patient to take home.  Iv site was removed. Discharge instructions given to pt and son at bedside along with prescriptions. Pt and family with no further questions at this time. Pt taken downstairs via wheelchair.  Last Vital Signs:  Blood pressure 147/85, pulse 89, temperature 99 F (37.2 C), temperature source Oral, resp. rate 18, height 5' 10.5" (1.791 m), weight 78.155 kg (172 lb 4.8 oz), SpO2 99 %.  Discharge Medication List   Medication List    STOP taking these medications        pravastatin 40 MG tablet  Commonly known as:  PRAVACHOL      TAKE these medications        amLODipine 10 MG tablet  Commonly known as:  NORVASC  Take 10 mg by mouth daily.     aspirin 81 MG tablet  Take 81 mg by mouth daily.     benztropine 1 MG tablet  Commonly known as:  COGENTIN  Take 1 tablet (1 mg total) by mouth 2 (two) times daily.     clotrimazole 1 % cream  Commonly known as:  LOTRIMIN  Apply 1 application topically 2 (two) times daily as needed (rash).     docusate sodium 100 MG capsule  Commonly known as:  COLACE  Take 1 capsule (100 mg total) by mouth 2 (two) times daily.     esomeprazole 20 MG capsule  Commonly known as:  NEXIUM  Take 20 mg by mouth daily.     haloperidol 5 MG tablet  Commonly known as:  HALDOL  Take1 tab in am, then take 2 tabs in pm     hydrALAZINE 25 MG tablet  Commonly known as:  APRESOLINE  Take 1 tablet (25 mg total) by mouth 3 (three) times daily.     HYDROcodone-acetaminophen 5-325 MG tablet  Commonly known as:  NORCO/VICODIN  Take 1 tablet by mouth every 6 (six) hours as  needed for moderate pain.     insulin detemir 100 UNIT/ML injection  Commonly known as:  LEVEMIR  Inject 0.05 mLs (5 Units total) into the skin daily.     metoprolol 100 MG tablet  Commonly known as:  LOPRESSOR  Take 100 mg by mouth 2 (two) times daily.     patiromer 8.4 g packet  Commonly known as:  VELTASSA  Take 8.4 g by mouth daily.     polyethylene glycol packet  Commonly known as:  MIRALAX / GLYCOLAX  Take 17 g by mouth daily.     promethazine 25 MG tablet  Commonly known as:  PHENERGAN  Take 25 mg by mouth every 6 (six) hours as needed for nausea or vomiting.     promethazine 25 MG suppository  Commonly known as:  PHENERGAN  Place 25 mg rectally every 6 (six) hours as needed for nausea or vomiting.     sodium bicarbonate 650 MG tablet  Take 1 tablet (650 mg total) by mouth 3 (three) times daily.

## 2016-02-24 NOTE — Discharge Instructions (Signed)
Acute Kidney Injury °Acute kidney injury is any condition in which there is sudden (acute) damage to the kidneys. Acute kidney injury was previously known as acute kidney failure or acute renal failure. The kidneys are two organs that lie on either side of the spine between the middle of the back and the front of the abdomen. The kidneys: °· Remove wastes and extra water from the blood.   °· Produce important hormones. These help keep bones strong, regulate blood pressure, and help create red blood cells.   °· Balance the fluids and chemicals in the blood and tissues. °A small amount of kidney damage may not cause problems, but a large amount of damage may make it difficult or impossible for the kidneys to work the way they should. Acute kidney injury may develop into long-lasting (chronic) kidney disease. It may also develop into a life-threatening disease called end-stage kidney disease. Acute kidney injury can get worse very quickly, so it should be treated right away. Early treatment may prevent other kidney diseases from developing. °CAUSES  °· A problem with blood flow to the kidneys. This may be caused by:   °¨ Blood loss.   °¨ Heart disease.   °¨ Severe burns.   °¨ Liver disease. °· Direct damage to the kidneys. This may be caused by: °¨ Some medicines.   °¨ A kidney infection.   °¨ Poisoning or consuming toxic substances.   °¨ A surgical wound.   °¨ A blow to the kidney area.   °· A problem with urine flow. This may be caused by:   °¨ Cancer.   °¨ Kidney stones.   °¨ An enlarged prostate. °SIGNS AND SYMPTOMS  °· Swelling (edema) of the legs, ankles, or feet.   °· Tiredness (lethargy).   °· Nausea or vomiting.   °· Confusion.   °· Problems with urination, such as:   °¨ Painful or burning feeling during urination.   °¨ Decreased urine production.   °¨ Frequent accidents in children who are potty trained.   °¨ Bloody urine.   °· Muscle twitches and cramps.   °· Shortness of breath.   °· Seizures.   °· Chest  pain or pressure. °Sometimes, no symptoms are present.  °DIAGNOSIS °Acute kidney injury may be detected and diagnosed by tests, including blood, urine, imaging, or kidney biopsy tests.  °TREATMENT °Treatment of acute kidney injury varies depending on the cause and severity of the kidney damage. In mild cases, no treatment may be needed. The kidneys may heal on their own. If acute kidney injury is more severe, your health care provider will treat the cause of the kidney damage, help the kidneys heal, and prevent complications from occurring. Severe cases may require a procedure to remove toxic wastes from the body (dialysis) or surgery to repair kidney damage. Surgery may involve:  °· Repair of a torn kidney.   °· Removal of an obstruction. °HOME CARE INSTRUCTIONS °· Follow your prescribed diet. °· Take medicines only as directed by your health care provider.  °· Do not take any new medicines (prescription, over-the-counter, or nutritional supplements) unless approved by your health care provider. Many medicines can worsen your kidney damage or may need to have the dose adjusted.   °· Keep all follow-up visits as directed by your health care provider. This is important. °· Observe your condition to make sure you are healing as expected. °SEEK IMMEDIATE MEDICAL CARE IF: °· You are feeling ill or have severe pain in the back or side.   °· Your symptoms return or you have new symptoms. °· You have any symptoms of end-stage kidney disease. These include:   °¨ Persistent itchiness.   °¨   Loss of appetite.   °¨ Headaches.   °¨ Abnormally dark or light skin. °¨ Numbness in the hands or feet.   °¨ Easy bruising.   °¨ Frequent hiccups.   °¨ Menstruation stops.   °· You have a fever. °· You have increased urine production. °· You have pain or bleeding when urinating. °MAKE SURE YOU:  °· Understand these instructions. °· Will watch your condition. °· Will get help right away if you are not doing well or get worse. °  °This  information is not intended to replace advice given to you by your health care provider. Make sure you discuss any questions you have with your health care provider. °  °Document Released: 06/18/2011 Document Revised: 12/24/2014 Document Reviewed: 08/01/2012 °Elsevier Interactive Patient Education ©2016 [employer] ° °

## 2016-03-27 ENCOUNTER — Encounter (HOSPITAL_COMMUNITY): Payer: Self-pay | Admitting: Emergency Medicine

## 2016-03-27 ENCOUNTER — Encounter (HOSPITAL_COMMUNITY): Payer: Self-pay | Admitting: *Deleted

## 2016-03-27 ENCOUNTER — Emergency Department (HOSPITAL_COMMUNITY)
Admission: EM | Admit: 2016-03-27 | Discharge: 2016-03-27 | Disposition: A | Discharge status: Home / Self Care | Attending: Emergency Medicine | Admitting: Emergency Medicine

## 2016-03-27 ENCOUNTER — Emergency Department (HOSPITAL_COMMUNITY)

## 2016-03-27 ENCOUNTER — Emergency Department (HOSPITAL_COMMUNITY)
Admission: EM | Admit: 2016-03-27 | Discharge: 2016-03-27 | Disposition: A | Discharge status: Home / Self Care | Source: Home / Self Care | Attending: Emergency Medicine | Admitting: Emergency Medicine

## 2016-03-27 DIAGNOSIS — Y9289 Other specified places as the place of occurrence of the external cause: Secondary | ICD-10-CM

## 2016-03-27 DIAGNOSIS — W1839XA Other fall on same level, initial encounter: Secondary | ICD-10-CM | POA: Insufficient documentation

## 2016-03-27 DIAGNOSIS — Z87442 Personal history of urinary calculi: Secondary | ICD-10-CM | POA: Insufficient documentation

## 2016-03-27 DIAGNOSIS — Z79899 Other long term (current) drug therapy: Secondary | ICD-10-CM | POA: Insufficient documentation

## 2016-03-27 DIAGNOSIS — E119 Type 2 diabetes mellitus without complications: Secondary | ICD-10-CM | POA: Insufficient documentation

## 2016-03-27 DIAGNOSIS — I129 Hypertensive chronic kidney disease with stage 1 through stage 4 chronic kidney disease, or unspecified chronic kidney disease: Secondary | ICD-10-CM

## 2016-03-27 DIAGNOSIS — R1031 Right lower quadrant pain: Secondary | ICD-10-CM

## 2016-03-27 DIAGNOSIS — S3992XA Unspecified injury of lower back, initial encounter: Secondary | ICD-10-CM | POA: Insufficient documentation

## 2016-03-27 DIAGNOSIS — K219 Gastro-esophageal reflux disease without esophagitis: Secondary | ICD-10-CM | POA: Insufficient documentation

## 2016-03-27 DIAGNOSIS — N189 Chronic kidney disease, unspecified: Secondary | ICD-10-CM

## 2016-03-27 DIAGNOSIS — Y998 Other external cause status: Secondary | ICD-10-CM | POA: Insufficient documentation

## 2016-03-27 DIAGNOSIS — Y9389 Activity, other specified: Secondary | ICD-10-CM | POA: Insufficient documentation

## 2016-03-27 DIAGNOSIS — N186 End stage renal disease: Secondary | ICD-10-CM | POA: Diagnosis not present

## 2016-03-27 DIAGNOSIS — I120 Hypertensive chronic kidney disease with stage 5 chronic kidney disease or end stage renal disease: Secondary | ICD-10-CM | POA: Insufficient documentation

## 2016-03-27 DIAGNOSIS — R4182 Altered mental status, unspecified: Secondary | ICD-10-CM | POA: Insufficient documentation

## 2016-03-27 DIAGNOSIS — S0990XA Unspecified injury of head, initial encounter: Secondary | ICD-10-CM | POA: Insufficient documentation

## 2016-03-27 DIAGNOSIS — W19XXXA Unspecified fall, initial encounter: Secondary | ICD-10-CM

## 2016-03-27 DIAGNOSIS — S3991XA Unspecified injury of abdomen, initial encounter: Secondary | ICD-10-CM | POA: Insufficient documentation

## 2016-03-27 DIAGNOSIS — N19 Unspecified kidney failure: Secondary | ICD-10-CM

## 2016-03-27 DIAGNOSIS — Z7982 Long term (current) use of aspirin: Secondary | ICD-10-CM | POA: Insufficient documentation

## 2016-03-27 DIAGNOSIS — Z515 Encounter for palliative care: Secondary | ICD-10-CM | POA: Diagnosis not present

## 2016-03-27 DIAGNOSIS — Z794 Long term (current) use of insulin: Secondary | ICD-10-CM | POA: Insufficient documentation

## 2016-03-27 LAB — CBC
HEMATOCRIT: 28.6 % — AB (ref 39.0–52.0)
HEMOGLOBIN: 9.4 g/dL — AB (ref 13.0–17.0)
MCH: 28.4 pg (ref 26.0–34.0)
MCHC: 32.9 g/dL (ref 30.0–36.0)
MCV: 86.4 fL (ref 78.0–100.0)
Platelets: 151 10*3/uL (ref 150–400)
RBC: 3.31 MIL/uL — AB (ref 4.22–5.81)
RDW: 13.3 % (ref 11.5–15.5)
WBC: 12.6 10*3/uL — ABNORMAL HIGH (ref 4.0–10.5)

## 2016-03-27 LAB — COMPREHENSIVE METABOLIC PANEL
ALBUMIN: 4 g/dL (ref 3.5–5.0)
ALK PHOS: 88 U/L (ref 38–126)
ALT: 38 U/L (ref 17–63)
ANION GAP: 15 (ref 5–15)
AST: 71 U/L — AB (ref 15–41)
BILIRUBIN TOTAL: 0.4 mg/dL (ref 0.3–1.2)
BUN: 122 mg/dL — AB (ref 6–20)
CALCIUM: 8.9 mg/dL (ref 8.9–10.3)
CO2: 13 mmol/L — AB (ref 22–32)
CREATININE: 8.61 mg/dL — AB (ref 0.61–1.24)
Chloride: 115 mmol/L — ABNORMAL HIGH (ref 101–111)
GFR calc Af Amer: 6 mL/min — ABNORMAL LOW (ref >60)
GFR calc non Af Amer: 5 mL/min — ABNORMAL LOW (ref >60)
GLUCOSE: 150 mg/dL — AB (ref 65–99)
Potassium: 5 mmol/L (ref 3.5–5.1)
SODIUM: 143 mmol/L (ref 135–145)
TOTAL PROTEIN: 8.1 g/dL (ref 6.5–8.1)

## 2016-03-27 LAB — ETHANOL: Alcohol, Ethyl (B): <5 mg/dL (ref <5)

## 2016-03-27 MED ORDER — IOHEXOL 300 MG/ML  SOLN: 25.0000 mL | Freq: Once | INTRAMUSCULAR | Status: DC | PRN

## 2016-03-27 NOTE — ED Notes (Signed)
MD at bedside. 

## 2016-03-27 NOTE — ED Notes (Signed)
Bed: [id number] Expected date:  Expected time:  Means of arrival:  Comments: EMS- 69yo M, back pain/abdominal pain/Hospice Pt
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
## 2016-03-27 NOTE — ED Notes (Signed)
Bed: [id number] Expected date:  Expected time:  Means of arrival:  Comments: EMS- 69y M, fall/hospice Pt

## 2016-03-27 NOTE — ED Notes (Signed)
Per EMS, patient was seen earlier today for fall. When patient arrived back home, patient began to complain of back pain/right lower quadrant pain. Patient's family states he could not lay down flat in the bed due to pain. Patient is in hospice and patient has end stage renal failure.

## 2016-03-27 NOTE — Progress Notes (Signed)
[facility] ED Room 21-[facility]-RN Visit  [facility] for CKD. Patient has an OOF DNR. Family reports patient has had multiple falls in the past couple of weeks.  He was found this morning around 10AM on the ground, which prompted EMS to be activated.  Family voiced concerned that they feel his home environment is no longer safe.  He resides with his elderly sister, Rhunette CroftMildred and her husband, who has dementia and also has been having multiple falls.  His ex-wife and son are the only other family members and state they have limited space and are unable to care for them in their home.  Patient seen in room with son and ex-wife at bedside. Patient is confused and unable to participate in meaningful conversation. He is on RA with O2 sats and respirations WNL.  Spoke to bedside RN, Molli HazardMatthew to update on living arrangements.  Provided updated [facility] medication list and transfer summary. Provided emotional support and left contact information for [facility] and [facility].  Please call with any hospice-related questions or concerns.  Thank you,  Hessie KnowsStacie Wilkinson RN, BSN [facility] Hospital Liaison [phone]

## 2016-03-27 NOTE — Progress Notes (Addendum)
[facility] and services for pt EDP states pt was noted to be falling when going to bathroom.  EDP discussed bedside commode with family  Family did mention pt caregiver has a demented spouse, interest in increased services in  Home or placement for pt Cm spoke with Stacie of Hospice [phone] who is familiar with pt Cm discussed [facility] St[facility]ie with initiate orders for bedside commode and get pt present Hospice SW to see pt & family for possible increased services in home or facility placement- aware of caregiver taking care of pt and a demented spouse Cm went to pt room and updated him and male and male family at bedside They are "okay" with bedside commode and pending call from hospice staff or hospice SW

## 2016-03-27 NOTE — ED Provider Notes (Signed)
CSN: [id number]     Arrival date & time 03/27/16  1154 History   First MD Initiated Contact with Patient 03/27/16 1209     Chief Complaint  Patient presents with  . Fall     Level V caveat: Altered mental status  HPI Patient is at home with home hospice for end-stage renal disease.  The patient from the beginning has refused dialysis and this is well-documented in the chart and well-known by all family members.  He is brought to the emergency department today because of a fall yesterday followed by another fall today.  Family reports that he has fallen more this week and this abnormal.  The also report that he seems more confused today.  He normally stays in the bed but he does get up and uses walker to go the restroom and that is when these falls have occurred.  No reports of vomiting or diarrhea.  The report that his appetite is still strong.  No reports of vomiting.  Patient has had some diarrhea..Emergency department by EMS.  He presents with obvious trauma to the right side of his head   Past Medical History  Diagnosis Date  . Diabetes mellitus without complication (HCC)   . Hypertension   . GERD (gastroesophageal reflux disease)   . Hyperlipidemia   . Chronic kidney disease     kidney stones   Past Surgical History  Procedure Laterality Date  . No past surgeries    . Cystoscopy with retrograde pyelogram, ureteroscopy and stent placement Left 04/05/2014    Procedure: CYSTOSCOPY WITH RETROGRADE PYELOGRAM, POSSIBLE LEFT URETEROSCOPY AND LEFT STENT PLACEMENT;  Surgeon: Bjorn Pippin, MD;  Location: [facility];  Service: Urology;  Laterality: Left;  . Holmium laser application Left 04/05/2014    Procedure: HOLMIUM LASER APPLICATION;  Surgeon: Bjorn Pippin, MD;  Location: [facility] ORS;  Service: Urology;  Laterality: Left;  . Lithotripsy    . Kidney surgery     Family History  Problem Relation Age of Onset  . Stroke Mother   . Heart disease Father    Social History  Substance Use Topics  .  Smoking status: Never Smoker   . Smokeless tobacco: Never Used  . Alcohol Use: No    Review of Systems  Unable to perform ROS: Mental status change      Allergies  Bee venom  Home Medications   Prior to Admission medications   Medication Sig Start Date End Date Taking? Authorizing Provider  amLODipine (NORVASC) 10 MG tablet Take 10 mg by mouth daily.   Yes Historical Provider, MD  aspirin 81 MG tablet Take 81 mg by mouth daily.   Yes Historical Provider, MD  benztropine (COGENTIN) 1 MG tablet Take 1 tablet (1 mg total) by mouth 2 (two) times daily. 01/04/16  Yes Archer Asa, MD  clotrimazole (LOTRIMIN) 1 % cream Apply 1 application topically 2 (two) times daily as needed (rash).   Yes Historical Provider, MD  docusate sodium (COLACE) 100 MG capsule Take 1 capsule (100 mg total) by mouth 2 (two) times daily. Patient taking differently: Take 100 mg by mouth 2 (two) times daily as needed for mild constipation or moderate constipation.  02/24/16  Yes Osvaldo Shipper, MD  esomeprazole (NEXIUM) 20 MG capsule Take 20 mg by mouth daily.    Yes Historical Provider, MD  haloperidol (HALDOL) 5 MG tablet Take1 tab in am, then take 2 tabs in pm 01/04/16  Yes Archer Asa, MD  hydrALAZINE (APRESOLINE) 25 MG tablet  Take 1 tablet (25 mg total) by mouth 3 (three) times daily. 06/11/15  Yes Christiane Ha, MD  HYDROcodone-acetaminophen (NORCO/VICODIN) 5-325 MG tablet Take 1 tablet by mouth every 6 (six) hours as needed for moderate pain. 02/24/16  Yes Osvaldo Shipper, MD  insulin detemir (LEVEMIR) 100 UNIT/ML injection Inject 0.05 mLs (5 Units total) into the skin daily. Patient taking differently: Inject 16 Units into the skin daily.  02/24/16  Yes Osvaldo Shipper, MD  metoprolol (LOPRESSOR) 100 MG tablet Take 100 mg by mouth 2 (two) times daily.   Yes Historical Provider, MD  patiromer (VELTASSA) 8.4 g packet Take 8.4 g by mouth daily.   Yes Historical Provider, MD  polyethylene glycol (MIRALAX /  GLYCOLAX) packet Take 17 g by mouth daily. 02/24/16  Yes Osvaldo Shipper, MD  promethazine (PHENERGAN) 25 MG suppository Place 25 mg rectally every 6 (six) hours as needed for nausea or vomiting.   Yes Historical Provider, MD  promethazine (PHENERGAN) 25 MG tablet Take 25 mg by mouth every 6 (six) hours as needed for nausea or vomiting.   Yes Historical Provider, MD  sodium bicarbonate 650 MG tablet Take 1 tablet (650 mg total) by mouth 3 (three) times daily. 02/24/16  Yes Osvaldo Shipper, MD   BP 153/76 mmHg  Pulse 97  Temp(Src) 98.2 F (36.8 C) (Oral)  Resp 15  SpO2 100% Physical Exam  Constitutional: He appears well-developed and well-nourished.  HENT:  Head: Normocephalic and atraumatic.  Eyes: EOM are normal.  Neck: Normal range of motion.  Cardiovascular: Normal rate and regular rhythm.   Pulmonary/Chest: Effort normal and breath sounds normal. No respiratory distress.  Abdominal: Soft. He exhibits no distension. There is no tenderness.  Musculoskeletal: Normal range of motion.  Neurological: He is alert.  Follow simple commands.  Moves all 4 extremity is equally  Skin: Skin is warm and dry.  Psychiatric: He has a normal mood and affect. Judgment normal.  Nursing note and vitals reviewed.   ED Course  Procedures (including critical care time) Labs Review Labs Reviewed  CBC - Abnormal; Notable for the following:    WBC 12.6 (*)    RBC 3.31 (*)    Hemoglobin 9.4 (*)    HCT 28.6 (*)    All other components within normal limits  COMPREHENSIVE METABOLIC PANEL - Abnormal; Notable for the following:    Chloride 115 (*)    CO2 13 (*)    Glucose, Bld 150 (*)    BUN 122 (*)    Creatinine, Ser 8.61 (*)    AST 71 (*)    GFR calc non Af Amer 5 (*)    GFR calc Af Amer 6 (*)    All other components within normal limits  ETHANOL    Imaging Review Ct Head Wo Contrast  03/27/2016  CLINICAL DATA:  70 year old male with increased falls, including an unwitnessed fall today. Initial  encounter. EXAM: CT HEAD WITHOUT CONTRAST TECHNIQUE: Contiguous axial images were obtained from the base of the skull through the vertex without intravenous contrast. COMPARISON:  None available FINDINGS: Visualized paranasal sinuses and mastoids are clear. No scalp hematoma identified. No acute orbits soft tissue finding. Calvarium intact. Calcified atherosclerosis at the skull base. , the *SCRATCH* SPECT chronic lacunar infarct of the left basal ganglia. Asymmetric perisylvian volume loss on the left, no definite cortical encephalomalacia. No ventriculomegaly. Punctate dystrophic calcification in the dorsal left pons is nonspecific. Normal gray-white matter differentiation elsewhere. No cortically based acute infarct identified. No  acute intracranial hemorrhage identified. No suspicious intracranial vascular hyperdensity. IMPRESSION: 1. No acute intracranial abnormality. No acute traumatic injury identified. 2. 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 Electronically Signed   By: Odessa FlemingH  Hall M.D.   On: 03/27/2016 13:48   I have personally reviewed and evaluated these images and lab results as part of my medical decision-making.   EKG Interpretation None      MDM   Final diagnoses:  Uremia  ESRD (end stage renal disease) (HCC)  Fall, initial encounter  Head injury, initial encounter  Encounter for palliative care in home hospice    Patient with progression of his end-stage renal disease.  Palliative is involved.  The palliative coordinator in case management teams were involved in the emergency department to provide additional supplies at home to better serve his needs towards the end of his life.  I long discussion with family regarding the ongoing progression of his end-stage renal disease and that likely his falls and increasing confusion is secondary to worsening uremia.  Patient be discharged back home with hospice services.    Azalia BilisKevin Azariah Bonura, MD 03/27/16 [phone]

## 2016-03-27 NOTE — Discharge Instructions (Signed)
CT scan showed no life-threatening conditions. Follow-up your regular doctors.

## 2016-03-27 NOTE — ED Provider Notes (Signed)
CSN: [id number]     Arrival date & time 03/27/16  1730 History   First MD Initiated Contact with Patient 03/27/16 1738     Chief Complaint  Patient presents with  . Abdominal Pain  . Back Pain     (Consider location/radiation/quality/duration/timing/severity/associated sxs/prior Treatment) HPI.Marland KitchenMarland KitchenMarland KitchenLevel V caveat for altered mental status. Patient with end-stage renal disease who has decided not to accept dialysis. He is under the care of hospice. He was evaluated earlier this morning the ED after a fall. He now has right lower quadrant pain and " back pain. No fevers, sweats, chills.  Past Medical History  Diagnosis Date  . Diabetes mellitus without complication (HCC)   . Hypertension   . GERD (gastroesophageal reflux disease)   . Hyperlipidemia   . Chronic kidney disease     kidney stones   Past Surgical History  Procedure Laterality Date  . No past surgeries    . Cystoscopy with retrograde pyelogram, ureteroscopy and stent placement Left 04/05/2014    Procedure: CYSTOSCOPY WITH RETROGRADE PYELOGRAM, POSSIBLE LEFT URETEROSCOPY AND LEFT STENT PLACEMENT;  Surgeon: Bjorn Pippin, MD;  Location: [facility];  Service: Urology;  Laterality: Left;  . Holmium laser application Left 04/05/2014    Procedure: HOLMIUM LASER APPLICATION;  Surgeon: Bjorn Pippin, MD;  Location: [facility];  Service: Urology;  Laterality: Left;  . Lithotripsy    . Kidney surgery     Family History  Problem Relation Age of Onset  . Stroke Mother   . Heart disease Father    Social History  Substance Use Topics  . Smoking status: Never Smoker   . Smokeless tobacco: Never Used  . Alcohol Use: No    Review of Systems    Allergies  Bee venom  Home Medications   Prior to Admission medications   Medication Sig Start Date End Date Taking? Authorizing Provider  amLODipine (NORVASC) 10 MG tablet Take 10 mg by mouth daily.    Historical Provider, MD  aspirin 81 MG tablet Take 81 mg by mouth daily.    Historical  Provider, MD  benztropine (COGENTIN) 1 MG tablet Take 1 tablet (1 mg total) by mouth 2 (two) times daily. 01/04/16   Archer Asa, MD  clotrimazole (LOTRIMIN) 1 % cream Apply 1 application topically 2 (two) times daily as needed (rash).    Historical Provider, MD  docusate sodium (COLACE) 100 MG capsule Take 1 capsule (100 mg total) by mouth 2 (two) times daily. Patient taking differently: Take 100 mg by mouth 2 (two) times daily as needed for mild constipation or moderate constipation.  02/24/16   Osvaldo Shipper, MD  esomeprazole (NEXIUM) 20 MG capsule Take 20 mg by mouth daily.     Historical Provider, MD  haloperidol (HALDOL) 5 MG tablet Take1 tab in am, then take 2 tabs in pm 01/04/16   Archer Asa, MD  hydrALAZINE (APRESOLINE) 25 MG tablet Take 1 tablet (25 mg total) by mouth 3 (three) times daily. 06/11/15   Christiane Ha, MD  HYDROcodone-acetaminophen (NORCO/VICODIN) 5-325 MG tablet Take 1 tablet by mouth every 6 (six) hours as needed for moderate pain. 02/24/16   Osvaldo Shipper, MD  insulin detemir (LEVEMIR) 100 UNIT/ML injection Inject 0.05 mLs (5 Units total) into the skin daily. Patient taking differently: Inject 16 Units into the skin daily.  02/24/16   Osvaldo Shipper, MD  metoprolol (LOPRESSOR) 100 MG tablet Take 100 mg by mouth 2 (two) times daily.    Historical Provider, MD  patiromer Lelon Perla)  8.4 g packet Take 8.4 g by mouth daily.    Historical Provider, MD  polyethylene glycol (MIRALAX / GLYCOLAX) packet Take 17 g by mouth daily. 02/24/16   Osvaldo Shipper, MD  promethazine (PHENERGAN) 25 MG suppository Place 25 mg rectally every 6 (six) hours as needed for nausea or vomiting.    Historical Provider, MD  promethazine (PHENERGAN) 25 MG tablet Take 25 mg by mouth every 6 (six) hours as needed for nausea or vomiting.    Historical Provider, MD  sodium bicarbonate 650 MG tablet Take 1 tablet (650 mg total) by mouth 3 (three) times daily. 02/24/16   Osvaldo Shipper, MD   BP 151/95  mmHg  Pulse 95  Temp(Src) 98.4 F (36.9 C) (Oral)  Resp 16  Ht  (1.803 m)  Wt 172 lb (78.019 kg)  BMI 24.00 kg/m2  SpO2 100% Physical Exam  Constitutional:  Confused (normal)  HENT:  Head: Normocephalic and atraumatic.  Eyes: Conjunctivae and EOM are normal. Pupils are equal, round, and reactive to light.  Neck: Normal range of motion. Neck supple.  Cardiovascular: Normal rate and regular rhythm.   Pulmonary/Chest: Effort normal and breath sounds normal.  Abdominal: Soft. Bowel sounds are normal.  Minimal right lower quadrant tenderness  Musculoskeletal:  Thoracic and lumbar spine nontender  Neurological:  Unable  Skin: Skin is warm and dry.  Psychiatric:  Flat affect  Nursing note and vitals reviewed.   ED Course  Procedures (including critical care time) Labs Review Labs Reviewed - No data to display  Imaging Review Ct Abdomen Pelvis Wo Contrast  03/27/2016  CLINICAL DATA:  Acute onset of confusion. Status post unwitnessed fall. Initial encounter. EXAM: CT ABDOMEN AND PELVIS WITHOUT CONTRAST TECHNIQUE: Multidetector CT imaging of the abdomen and pelvis was performed following the standard protocol without IV contrast. COMPARISON:  CT of the abdomen and pelvis, and renal and right upper quadrant ultrasound, performed 02/21/2016 FINDINGS: Bronchiectasis and mild honeycombing is noted at the lung bases. No free air or free fluid is seen within the abdomen or pelvis. There is no evidence of solid or hollow organ injury. The liver and spleen are unremarkable in appearance. Stones are noted within the gallbladder. The gallbladder is otherwise unremarkable. The pancreas and adrenal glands are unremarkable. The kidneys are unremarkable in appearance. There is no evidence of hydronephrosis. No renal or ureteral stones are seen. No perinephric stranding is appreciated. The small bowel is unremarkable in appearance. The stomach is within normal limits. No acute vascular  abnormalities are seen. Minimal calcification is noted along the abdominal aorta and its branches. The appendix is not definitely characterized; there is no evidence of appendicitis. The colon is partially filled with stool and is unremarkable in appearance. The bladder is moderately distended and grossly unremarkable. The prostate remains normal in size. No inguinal lymphadenopathy is seen. No acute osseous abnormalities are identified. IMPRESSION: 1. No evidence of traumatic injury to the abdomen or pelvis. 2. Cholelithiasis.  Gallbladder otherwise unremarkable. 3. Bronchiectasis and mild honeycombing at the lung bases. Electronically Signed   By: Roanna Raider M.D.   On: 03/27/2016 20:02   Ct Head Wo Contrast  03/27/2016  CLINICAL DATA:  70 year old male with increased falls, including an unwitnessed fall today. Initial encounter. EXAM: CT HEAD WITHOUT CONTRAST TECHNIQUE: Contiguous axial images were obtained from the base of the skull through the vertex without intravenous contrast. COMPARISON:  None available FINDINGS: Visualized paranasal sinuses and mastoids are clear. No scalp hematoma identified. No acute  orbits soft tissue finding. Calvarium intact. Calcified atherosclerosis at the skull base. , the *SCRATCH* SPECT chronic lacunar infarct of the left basal ganglia. Asymmetric perisylvian volume loss on the left, no definite cortical encephalomalacia. No ventriculomegaly. Punctate dystrophic calcification in the dorsal left pons is nonspecific. Normal gray-white matter differentiation elsewhere. No cortically based acute infarct identified. No acute intracranial hemorrhage identified. No suspicious intracranial vascular hyperdensity. IMPRESSION: 1. No acute intracranial abnormality. No acute traumatic injury identified. 2. 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 Electronically Signed   By: Odessa FlemingH  Hall M.D.   On: 03/27/2016 13:48   I have personally reviewed and evaluated these images and lab  results as part of my medical decision-making.   EKG Interpretation None      MDM   Final diagnoses:  RLQ abdominal pain    Noncontrasted CT of abdomen pelvis shows cholelithiasis but no cholecystitis. No other acute anomalies.    Donnetta HutchingBrian Drina Jobst, MD 03/27/16 2205

## 2016-04-06 ENCOUNTER — Ambulatory Visit (HOSPITAL_COMMUNITY): Payer: Self-pay | Admitting: Psychiatry

## 2016-07-19 IMAGING — CT CT ABD-PELV W/O CM
2 of 4 series · 16 of 46 positions shown, 18 images · non-contrast
Comparison: CT of the abdomen and pelvis, and renal and right upper
quadrant ultrasound, performed 02/21/2016

CLINICAL DATA: Acute onset of confusion. Status post unwitnessed
fall. Initial encounter.

EXAM:
CT ABDOMEN AND PELVIS WITHOUT CONTRAST
TECHNIQUE: Multidetector CT imaging of the abdomen and pelvis was performed
following the standard protocol without IV contrast.

[Series 2: abd/pel w/o · axial · non-contrast · 0.74mm/px · z∈[+938,+1353]mm · 13 of 91 slices shown, 15 images]
[im 4/91  soft-tissue]
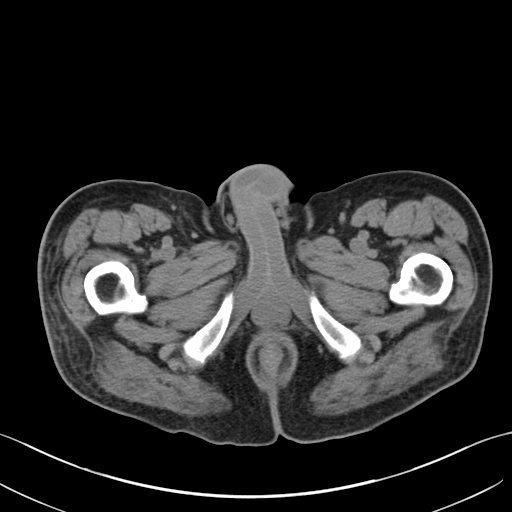
[im 4/91  bone]
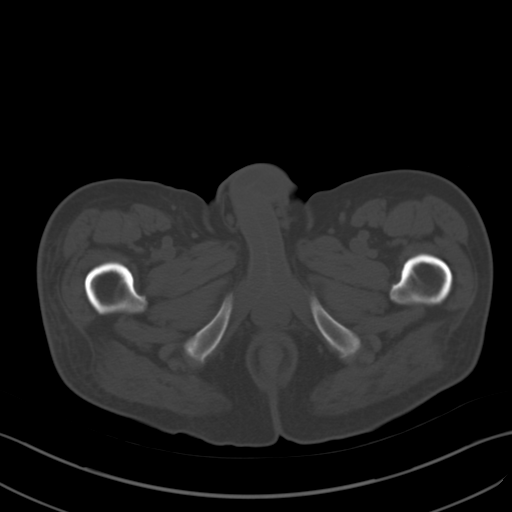
[im 12/91  soft-tissue]
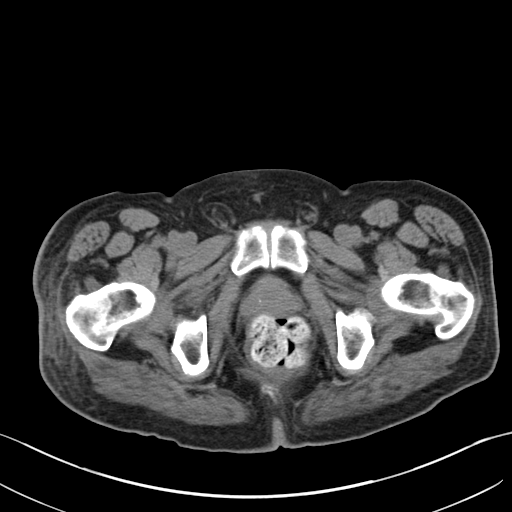
[im 20/91  soft-tissue]
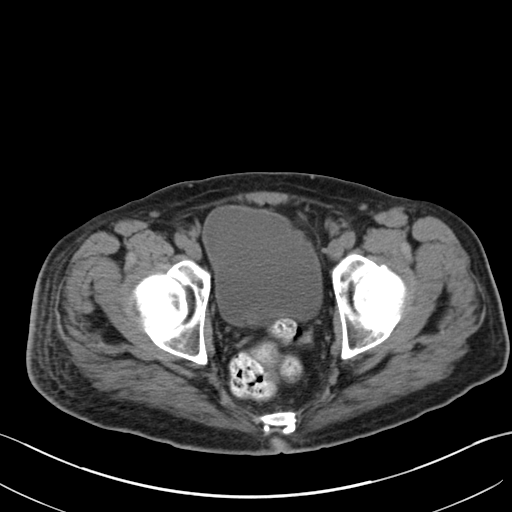
[im 24/91  soft-tissue]
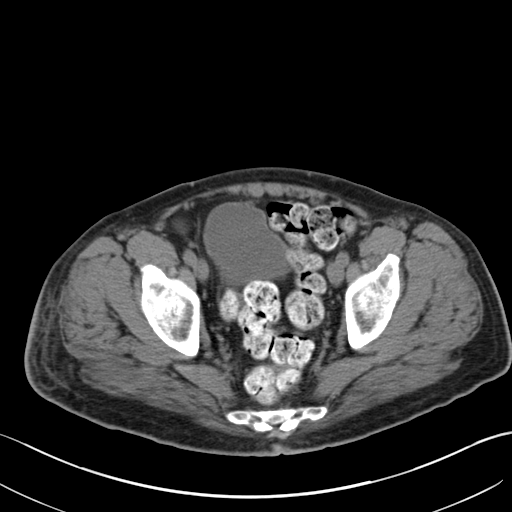
[im 32/91  soft-tissue]
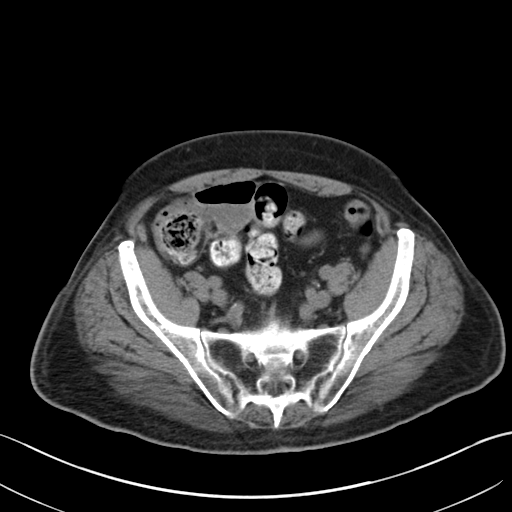
[im 40/91  soft-tissue]
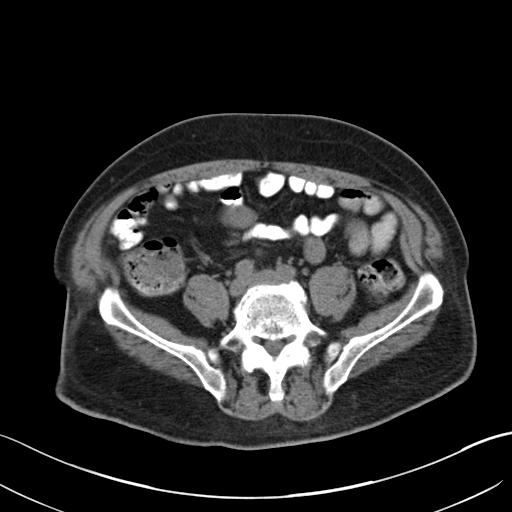
[im 47/91  soft-tissue]
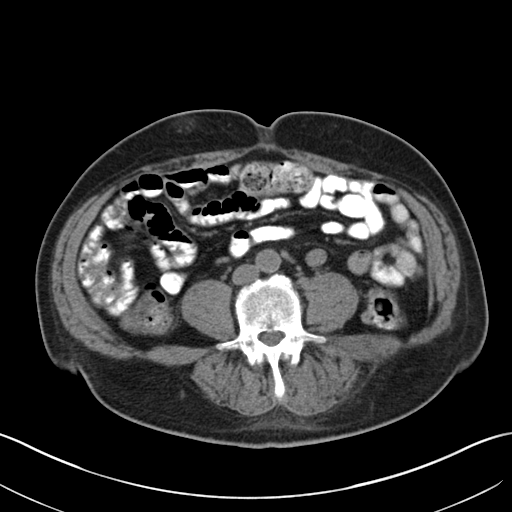
[im 51/91  soft-tissue]
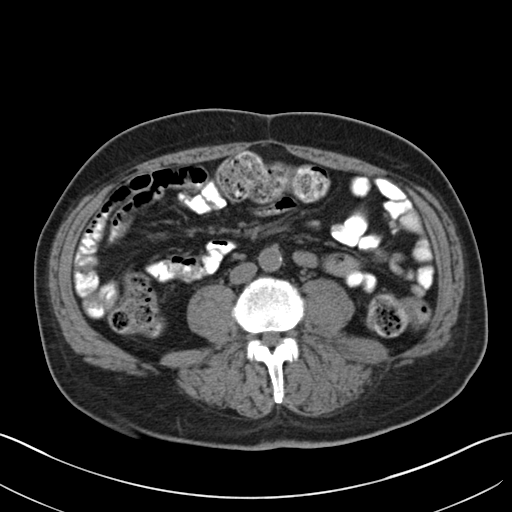
[im 59/91  soft-tissue]
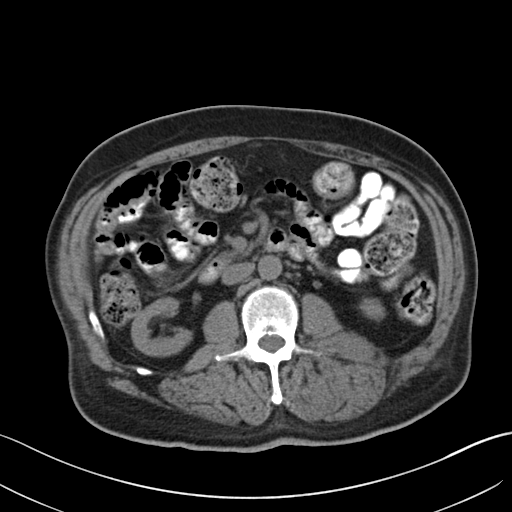
[im 59/91  bone]
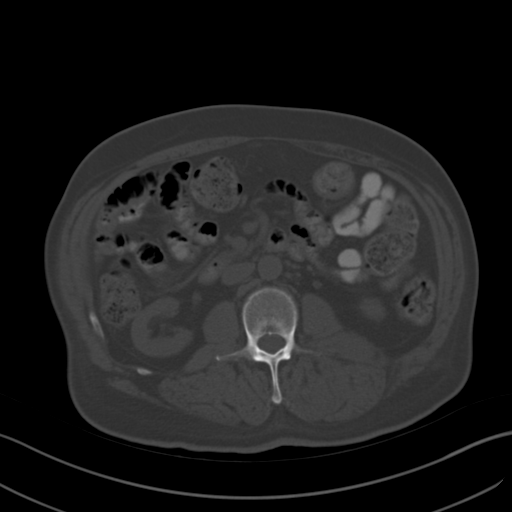
[im 67/91  soft-tissue]
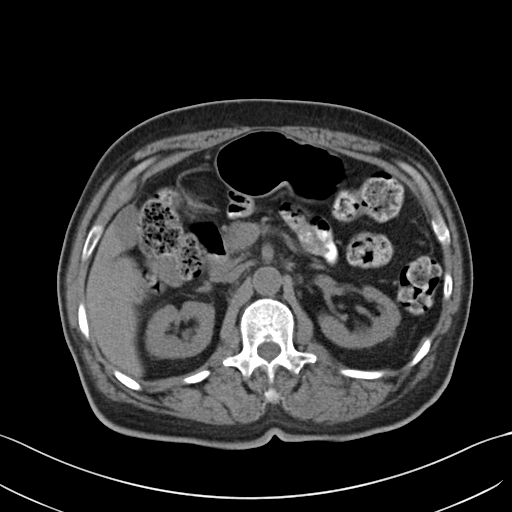
[im 71/91  soft-tissue]
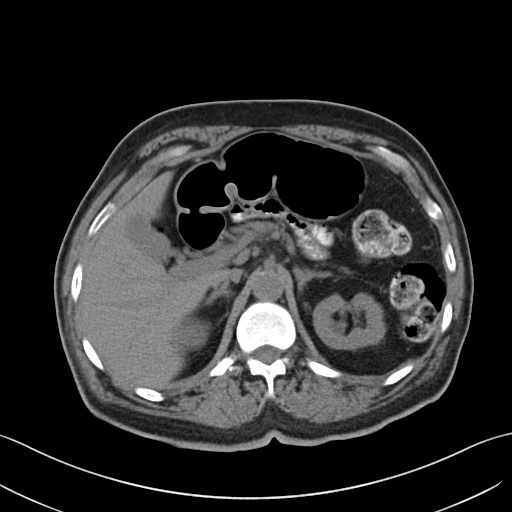
[im 79/91  soft-tissue]
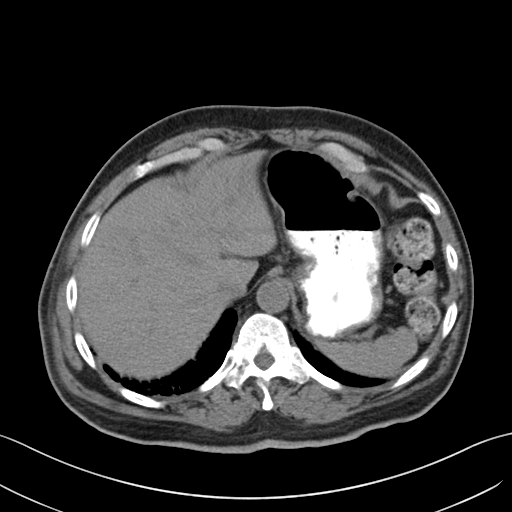
[im 87/91  soft-tissue]
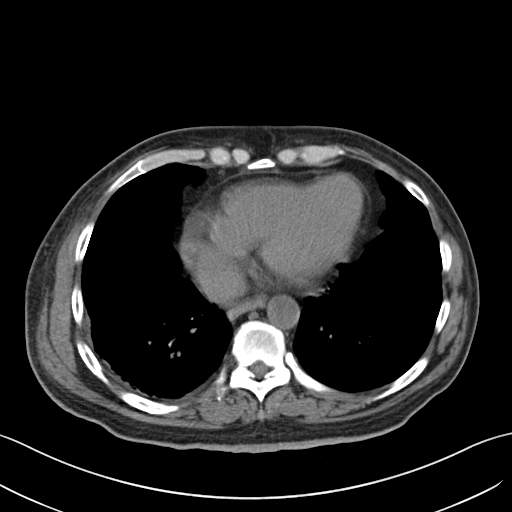

[Series 5: coronal · coronal · 0.65mm/px · 3 of 136 slices shown]
[im 46/136  soft-tissue]
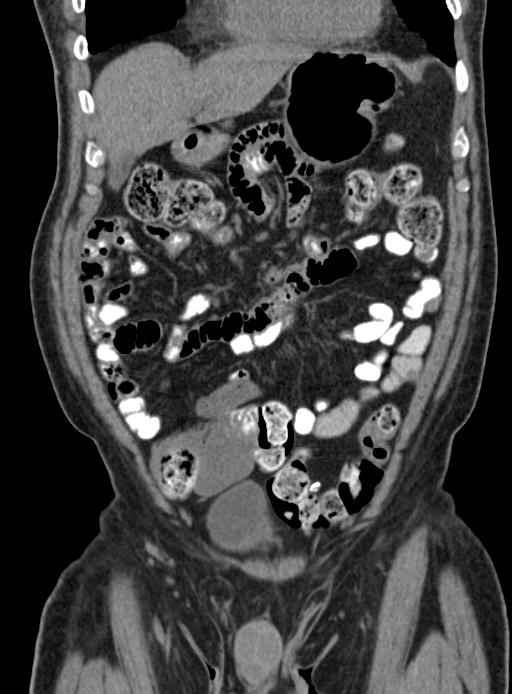
[im 61/136  soft-tissue]
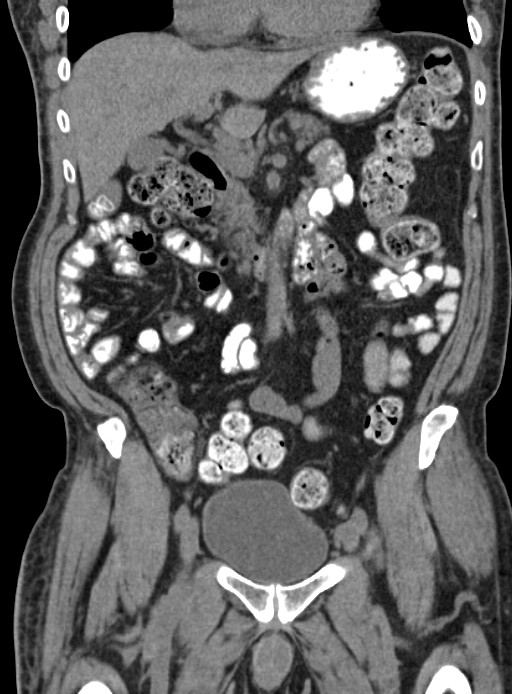
[im 76/136  soft-tissue]
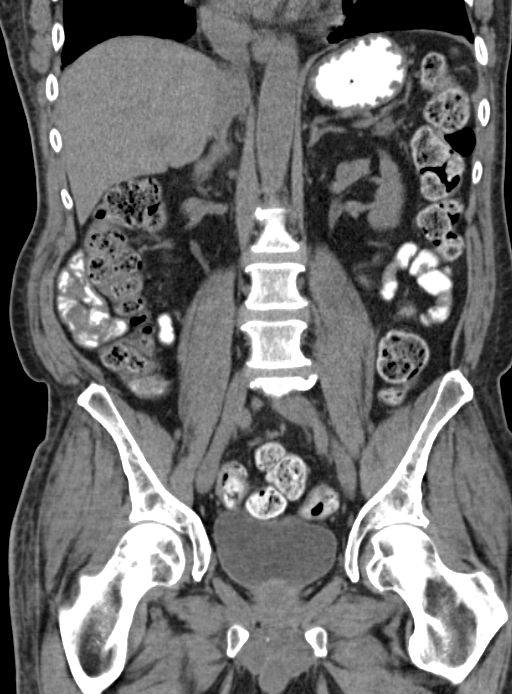

[16 of 46 positions shown; findings below may reference images not displayed]

FINDINGS: Bronchiectasis and mild honeycombing is noted at the lung bases.

No free air or free fluid is seen within the abdomen or pelvis.
There is no evidence of solid or hollow organ injury.

The liver and spleen are unremarkable in appearance. Stones are
noted within the gallbladder. The gallbladder is otherwise
unremarkable. The pancreas and adrenal glands are unremarkable.

The kidneys are unremarkable in appearance. There is no evidence of
hydronephrosis. No renal or ureteral stones are seen. No perinephric
stranding is appreciated.

The small bowel is unremarkable in appearance. The stomach is within
normal limits. No acute vascular abnormalities are seen. Minimal
calcification is noted along the abdominal aorta and its branches.

The appendix is not definitely characterized; there is no evidence
of appendicitis. The colon is partially filled with stool and is
unremarkable in appearance.

The bladder is moderately distended and grossly unremarkable. The
prostate remains normal in size. No inguinal lymphadenopathy is
seen.

No acute osseous abnormalities are identified.
IMPRESSION: 1. No evidence of traumatic injury to the abdomen or pelvis.
2. Cholelithiasis.  Gallbladder otherwise unremarkable.
3. Bronchiectasis and mild honeycombing at the lung bases.

## 2016-07-19 IMAGING — CT CT HEAD W/O CM
2 series · 16 of 30 positions shown, 19 images · non-contrast
Comparison: None available

CLINICAL DATA: 69-year-old male with increased falls, including an
unwitnessed fall today. Initial encounter.

EXAM:
CT HEAD WITHOUT CONTRAST
TECHNIQUE: Contiguous axial images were obtained from the base of the skull
through the vertex without intravenous contrast.

[Series 2: bone windows · axial · 0.45mm/px · z∈[-55,+47]mm · 7 of 52 slices shown]
[im 6/52  bone]
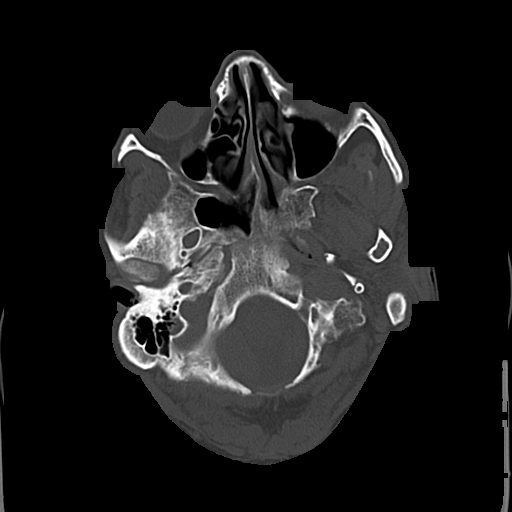
[im 12/52  bone]
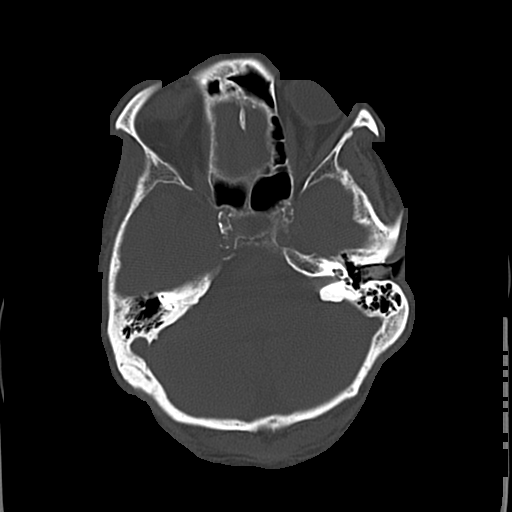
[im 18/52  bone]
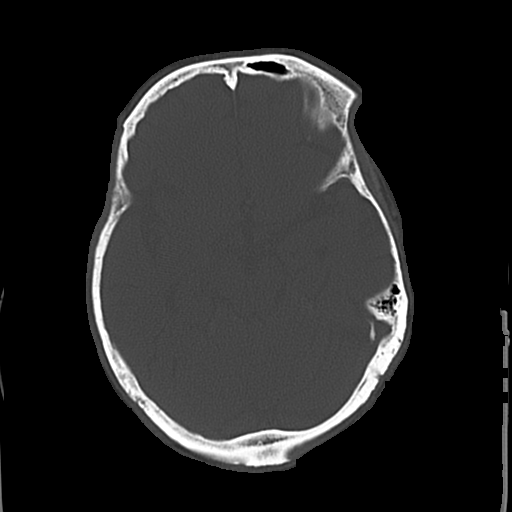
[im 23/52  bone]
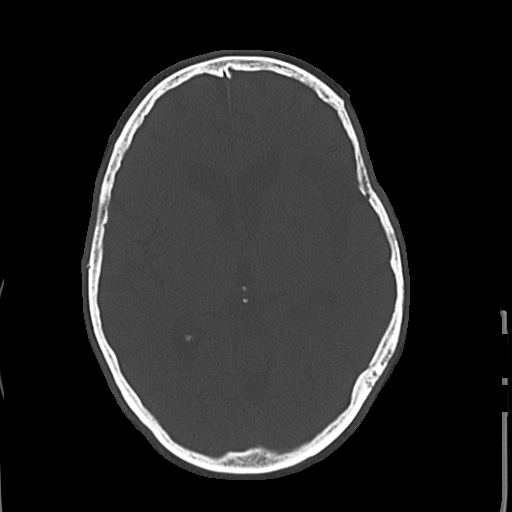
[im 29/52  bone]
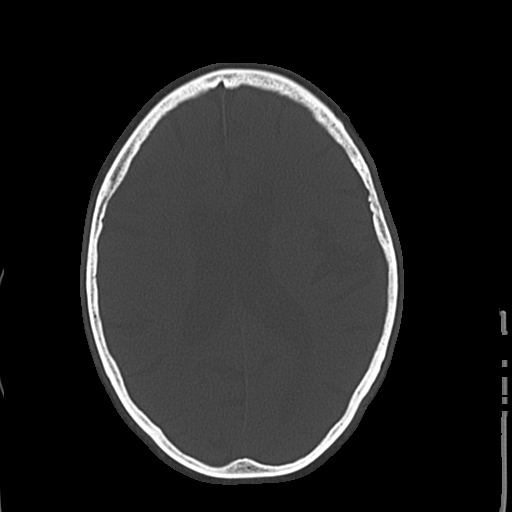
[im 35/52  bone]
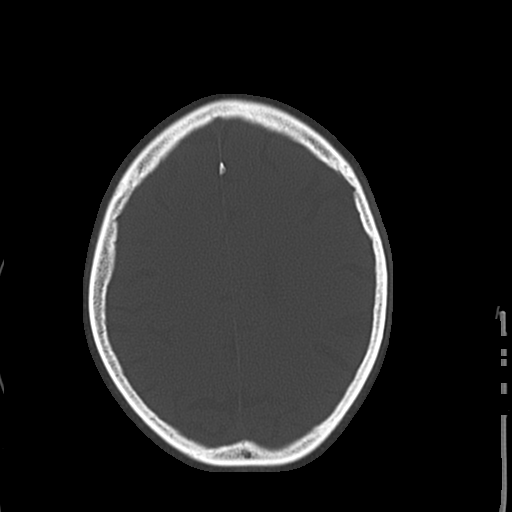
[im 40/52  bone]
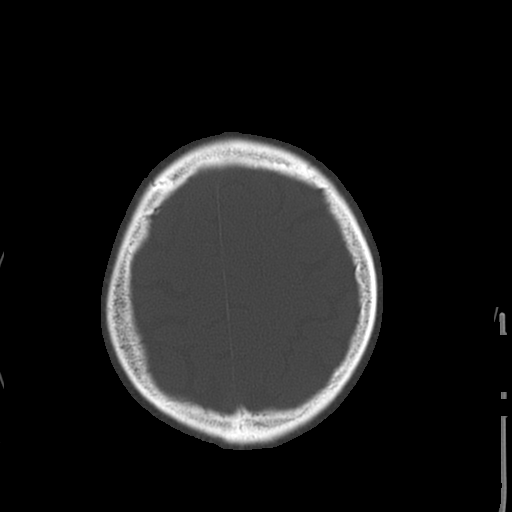

[Series 3: head w/o · axial · non-contrast · 0.45mm/px · z∈[-55,+65]mm · 9 of 32 slices shown, 12 images]
[im 4/32  brain]
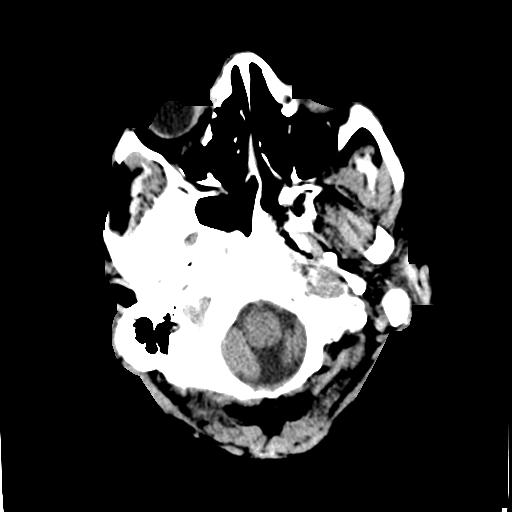
[im 4/32  bone]
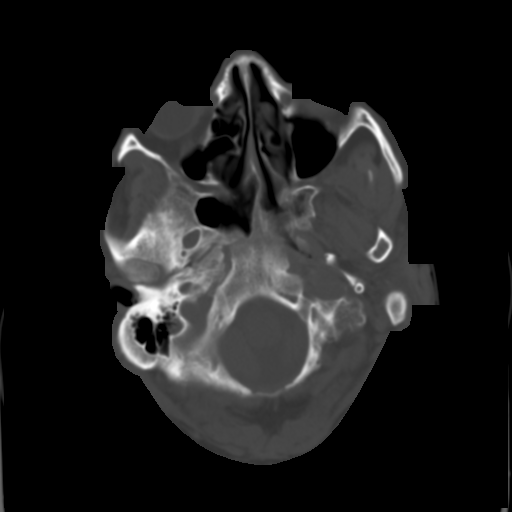
[im 7/32  brain]
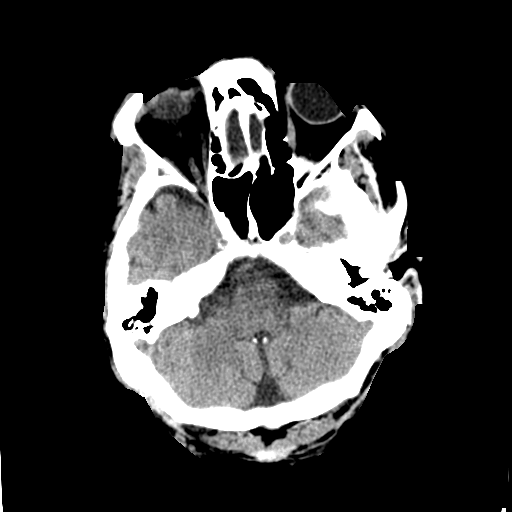
[im 10/32  brain]
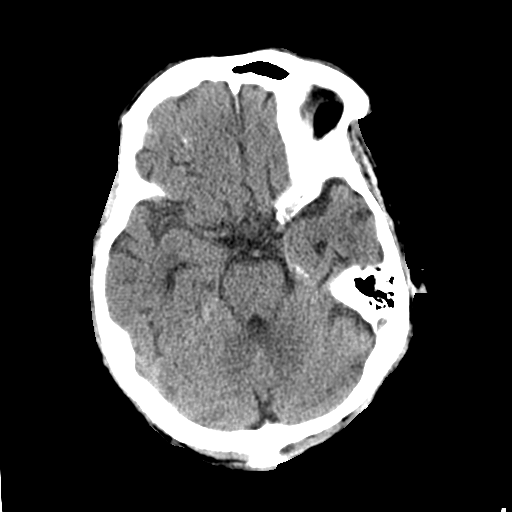
[im 13/32  brain]
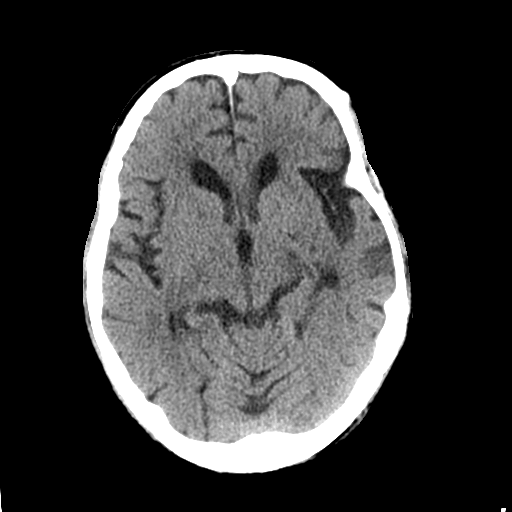
[im 16/32  brain]
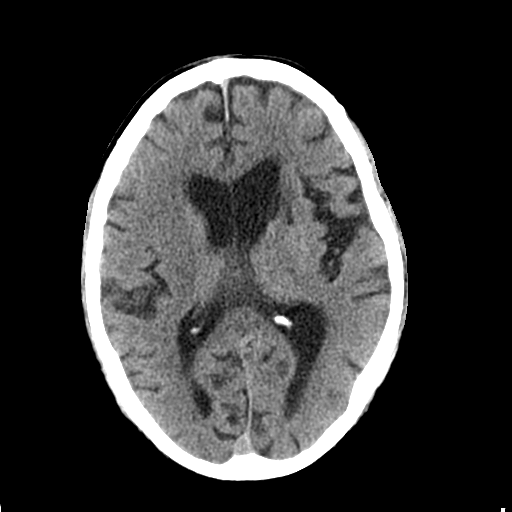
[im 16/32  bone]
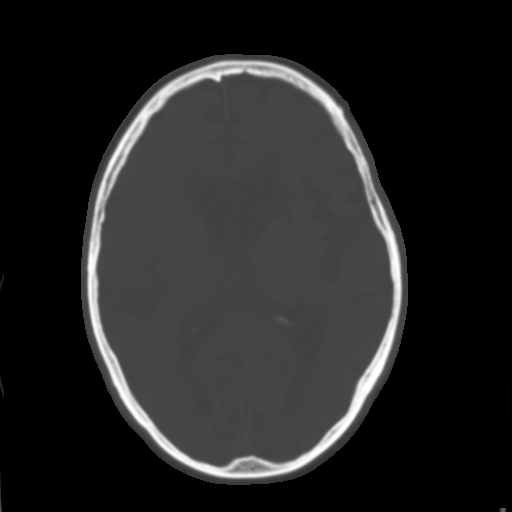
[im 19/32  brain]
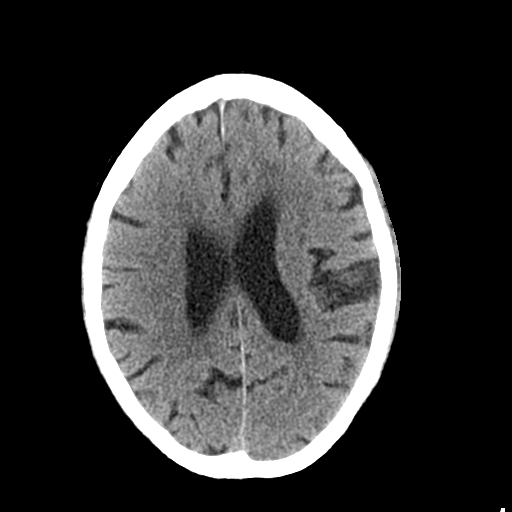
[im 22/32  brain]
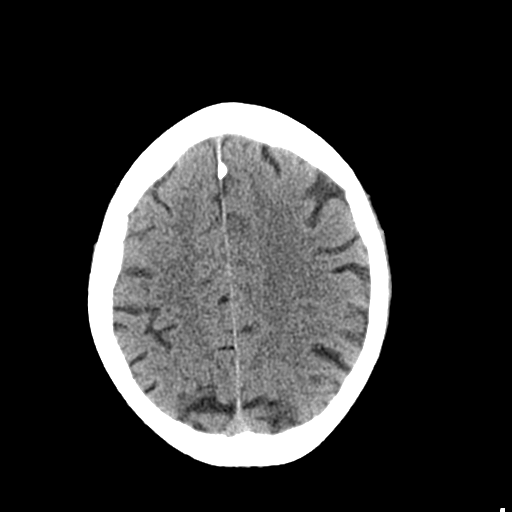
[im 25/32  brain]
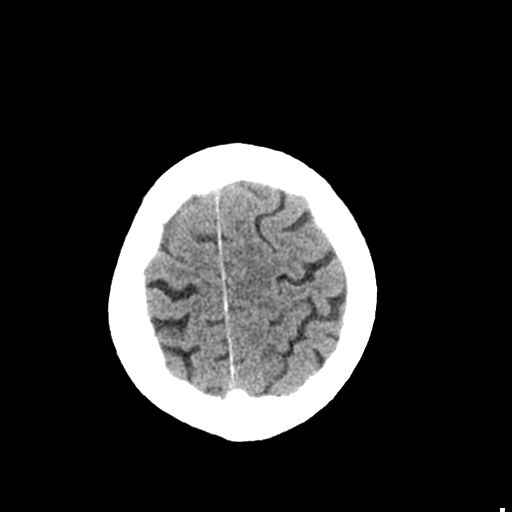
[im 28/32  brain]
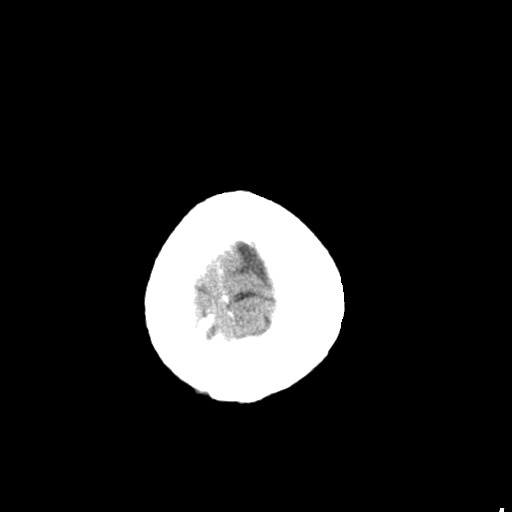
[im 28/32  bone]
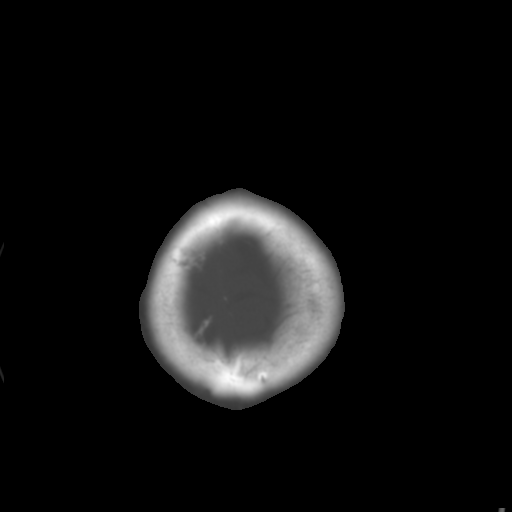

[16 of 30 positions shown; findings below may reference images not displayed]

FINDINGS: Visualized paranasal sinuses and mastoids are clear. No scalp
hematoma identified. No acute orbits soft tissue finding. Calvarium
intact.

Calcified atherosclerosis at the skull base. , the *SCRATCH* SPECT
chronic lacunar infarct of the left basal ganglia. Asymmetric
perisylvian volume loss on the left, no definite cortical
encephalomalacia. No ventriculomegaly. Punctate dystrophic
calcification in the dorsal left pons is nonspecific. Normal
gray-white matter differentiation elsewhere. No cortically based
acute infarct identified. No acute intracranial hemorrhage
identified. No suspicious intracranial vascular hyperdensity.
IMPRESSION: 1. No acute intracranial abnormality. No acute traumatic injury
identified.
2. Chronic left MCA territory small vessel ischemia.

## 2016-07-26 ENCOUNTER — Other Ambulatory Visit (HOSPITAL_COMMUNITY): Payer: Self-pay | Admitting: Psychiatry

## 2017-10-17 ENCOUNTER — Encounter (HOSPITAL_COMMUNITY): Payer: Self-pay

## 2017-10-17 ENCOUNTER — Emergency Department (HOSPITAL_COMMUNITY)

## 2017-10-17 ENCOUNTER — Inpatient Hospital Stay (HOSPITAL_COMMUNITY)
Admission: EM | Admit: 2017-10-17 | Discharge: 2017-10-21 | DRG: 871 | Disposition: A | Discharge status: Hospice | Attending: Family Medicine | Admitting: Family Medicine

## 2017-10-17 DIAGNOSIS — Z91030 Bee allergy status: Secondary | ICD-10-CM | POA: Diagnosis not present

## 2017-10-17 DIAGNOSIS — J189 Pneumonia, unspecified organism: Secondary | ICD-10-CM | POA: Diagnosis present

## 2017-10-17 DIAGNOSIS — K219 Gastro-esophageal reflux disease without esophagitis: Secondary | ICD-10-CM | POA: Diagnosis present

## 2017-10-17 DIAGNOSIS — I120 Hypertensive chronic kidney disease with stage 5 chronic kidney disease or end stage renal disease: Secondary | ICD-10-CM | POA: Diagnosis present

## 2017-10-17 DIAGNOSIS — F209 Schizophrenia, unspecified: Secondary | ICD-10-CM | POA: Diagnosis not present

## 2017-10-17 DIAGNOSIS — D631 Anemia in chronic kidney disease: Secondary | ICD-10-CM | POA: Diagnosis present

## 2017-10-17 DIAGNOSIS — Z515 Encounter for palliative care: Secondary | ICD-10-CM | POA: Diagnosis present

## 2017-10-17 DIAGNOSIS — E1122 Type 2 diabetes mellitus with diabetic chronic kidney disease: Secondary | ICD-10-CM | POA: Diagnosis present

## 2017-10-17 DIAGNOSIS — Z66 Do not resuscitate: Secondary | ICD-10-CM | POA: Diagnosis present

## 2017-10-17 DIAGNOSIS — Z823 Family history of stroke: Secondary | ICD-10-CM | POA: Diagnosis not present

## 2017-10-17 DIAGNOSIS — Z9115 Patient's noncompliance with renal dialysis: Secondary | ICD-10-CM | POA: Diagnosis not present

## 2017-10-17 DIAGNOSIS — N186 End stage renal disease: Secondary | ICD-10-CM | POA: Diagnosis present

## 2017-10-17 DIAGNOSIS — Z79899 Other long term (current) drug therapy: Secondary | ICD-10-CM | POA: Diagnosis not present

## 2017-10-17 DIAGNOSIS — R509 Fever, unspecified: Secondary | ICD-10-CM

## 2017-10-17 DIAGNOSIS — A419 Sepsis, unspecified organism: Secondary | ICD-10-CM | POA: Diagnosis present

## 2017-10-17 DIAGNOSIS — Z5329 Procedure and treatment not carried out because of patient's decision for other reasons: Secondary | ICD-10-CM | POA: Diagnosis present

## 2017-10-17 DIAGNOSIS — N390 Urinary tract infection, site not specified: Secondary | ICD-10-CM | POA: Diagnosis present

## 2017-10-17 DIAGNOSIS — Z8249 Family history of ischemic heart disease and other diseases of the circulatory system: Secondary | ICD-10-CM | POA: Diagnosis not present

## 2017-10-17 DIAGNOSIS — Z794 Long term (current) use of insulin: Secondary | ICD-10-CM

## 2017-10-17 DIAGNOSIS — R1110 Vomiting, unspecified: Secondary | ICD-10-CM | POA: Diagnosis not present

## 2017-10-17 DIAGNOSIS — E785 Hyperlipidemia, unspecified: Secondary | ICD-10-CM | POA: Diagnosis present

## 2017-10-17 DIAGNOSIS — Z87442 Personal history of urinary calculi: Secondary | ICD-10-CM | POA: Diagnosis not present

## 2017-10-17 DIAGNOSIS — G9341 Metabolic encephalopathy: Secondary | ICD-10-CM | POA: Diagnosis present

## 2017-10-17 DIAGNOSIS — Z7401 Bed confinement status: Secondary | ICD-10-CM | POA: Diagnosis not present

## 2017-10-17 HISTORY — DX: Acute kidney failure, unspecified: N17.9

## 2017-10-17 HISTORY — DX: Chronic kidney disease, unspecified: N18.9

## 2017-10-17 HISTORY — DX: Type 2 diabetes mellitus with unspecified complications: E11.8

## 2017-10-17 LAB — COMPREHENSIVE METABOLIC PANEL
ALK PHOS: 98 U/L (ref 38–126)
ALT: 12 U/L — AB (ref 17–63)
AST: 15 U/L (ref 15–41)
Albumin: 2.7 g/dL — ABNORMAL LOW (ref 3.5–5.0)
Anion gap: 12 (ref 5–15)
BILIRUBIN TOTAL: 0.7 mg/dL (ref 0.3–1.2)
BUN: 88 mg/dL — AB (ref 6–20)
CALCIUM: 8.1 mg/dL — AB (ref 8.9–10.3)
CO2: 16 mmol/L — ABNORMAL LOW (ref 22–32)
Chloride: 115 mmol/L — ABNORMAL HIGH (ref 101–111)
Creatinine, Ser: 12.03 mg/dL — ABNORMAL HIGH (ref 0.61–1.24)
GFR calc non Af Amer: 4 mL/min — ABNORMAL LOW (ref >60)
GFR, EST AFRICAN AMERICAN: 4 mL/min — AB (ref >60)
Glucose, Bld: 110 mg/dL — ABNORMAL HIGH (ref 65–99)
Potassium: 5.5 mmol/L — ABNORMAL HIGH (ref 3.5–5.1)
Sodium: 143 mmol/L (ref 135–145)
TOTAL PROTEIN: 7.5 g/dL (ref 6.5–8.1)

## 2017-10-17 LAB — CBC
HCT: 27.4 % — ABNORMAL LOW (ref 39.0–52.0)
Hemoglobin: 8.6 g/dL — ABNORMAL LOW (ref 13.0–17.0)
MCH: 27.3 pg (ref 26.0–34.0)
MCHC: 31.4 g/dL (ref 30.0–36.0)
MCV: 87 fL (ref 78.0–100.0)
PLATELETS: 119 10*3/uL — AB (ref 150–400)
RBC: 3.15 MIL/uL — ABNORMAL LOW (ref 4.22–5.81)
RDW: 14.3 % (ref 11.5–15.5)
WBC: 10.2 10*3/uL (ref 4.0–10.5)

## 2017-10-17 LAB — I-STAT CHEM 8, ED
BUN: 76 mg/dL — AB (ref 6–20)
CALCIUM ION: 1.04 mmol/L — AB (ref 1.15–1.40)
CHLORIDE: 118 mmol/L — AB (ref 101–111)
CREATININE: 12.3 mg/dL — AB (ref 0.61–1.24)
GLUCOSE: 108 mg/dL — AB (ref 65–99)
HCT: 31 % — ABNORMAL LOW (ref 39.0–52.0)
Hemoglobin: 10.5 g/dL — ABNORMAL LOW (ref 13.0–17.0)
Potassium: 5.5 mmol/L — ABNORMAL HIGH (ref 3.5–5.1)
Sodium: 146 mmol/L — ABNORMAL HIGH (ref 135–145)
TCO2: 19 mmol/L — ABNORMAL LOW (ref 22–32)

## 2017-10-17 LAB — CBC WITH DIFFERENTIAL/PLATELET
BASOS PCT: 0 %
Basophils Absolute: 0 10*3/uL (ref 0.0–0.1)
EOS ABS: 0 10*3/uL (ref 0.0–0.7)
EOS PCT: 0 %
HCT: 29 % — ABNORMAL LOW (ref 39.0–52.0)
Hemoglobin: 9.1 g/dL — ABNORMAL LOW (ref 13.0–17.0)
LYMPHS ABS: 0.6 10*3/uL — AB (ref 0.7–4.0)
Lymphocytes Relative: 7 %
MCH: 27 pg (ref 26.0–34.0)
MCHC: 31.4 g/dL (ref 30.0–36.0)
MCV: 86.1 fL (ref 78.0–100.0)
Monocytes Absolute: 1.3 10*3/uL — ABNORMAL HIGH (ref 0.1–1.0)
Monocytes Relative: 17 %
NEUTROS PCT: 76 %
Neutro Abs: 5.9 10*3/uL (ref 1.7–7.7)
PLATELETS: 134 10*3/uL — AB (ref 150–400)
RBC: 3.37 MIL/uL — ABNORMAL LOW (ref 4.22–5.81)
RDW: 14.6 % (ref 11.5–15.5)
WBC: 7.8 10*3/uL (ref 4.0–10.5)

## 2017-10-17 LAB — URINALYSIS, ROUTINE W REFLEX MICROSCOPIC
Bilirubin Urine: NEGATIVE
GLUCOSE, UA: NEGATIVE mg/dL
HGB URINE DIPSTICK: NEGATIVE
KETONES UR: NEGATIVE mg/dL
NITRITE: POSITIVE — AB
PH: 5 (ref 5.0–8.0)
Protein, ur: 100 mg/dL — AB
Specific Gravity, Urine: 1.012 (ref 1.005–1.030)

## 2017-10-17 LAB — CREATININE, SERUM
Creatinine, Ser: 11.49 mg/dL — ABNORMAL HIGH (ref 0.61–1.24)
GFR calc Af Amer: 4 mL/min — ABNORMAL LOW (ref >60)
GFR calc non Af Amer: 4 mL/min — ABNORMAL LOW (ref >60)

## 2017-10-17 LAB — I-STAT CG4 LACTIC ACID, ED: LACTIC ACID, VENOUS: 1.77 mmol/L (ref 0.5–1.9)

## 2017-10-17 MED ORDER — BENZTROPINE MESYLATE 1 MG PO TABS
1.0000 mg | ORAL_TABLET | Freq: Two times a day (BID) | ORAL | Status: DC
  Administered 2017-10-19 – 2017-10-21 (×4): 1 mg via ORAL
  Filled 2017-10-17 (×7): qty 1

## 2017-10-17 MED ORDER — HEPARIN SODIUM (PORCINE) 5000 UNIT/ML IJ SOLN
5000.0000 [IU] | Freq: Three times a day (TID) | INTRAMUSCULAR | Status: DC
  Administered 2017-10-17 – 2017-10-18 (×2): 5000 [IU] via SUBCUTANEOUS
  Filled 2017-10-17 (×2): qty 1

## 2017-10-17 MED ORDER — AMLODIPINE BESYLATE 5 MG PO TABS
5.0000 mg | ORAL_TABLET | Freq: Every day | ORAL | Status: DC
  Administered 2017-10-19 – 2017-10-20 (×2): 5 mg via ORAL
  Filled 2017-10-17 (×3): qty 1

## 2017-10-17 MED ORDER — HYDRALAZINE HCL 25 MG PO TABS
25.0000 mg | ORAL_TABLET | Freq: Three times a day (TID) | ORAL | Status: DC
  Administered 2017-10-19 – 2017-10-20 (×4): 25 mg via ORAL
  Filled 2017-10-17 (×7): qty 1

## 2017-10-17 MED ORDER — HALOPERIDOL 1 MG PO TABS: 5.0000 mg | ORAL_TABLET | ORAL | Status: DC

## 2017-10-17 MED ORDER — HALOPERIDOL 1 MG PO TABS
5.0000 mg | ORAL_TABLET | Freq: Every day | ORAL | Status: DC
  Filled 2017-10-17: qty 5

## 2017-10-17 MED ORDER — AMPICILLIN SODIUM 1 G IJ SOLR
1.0000 g | Freq: Two times a day (BID) | INTRAMUSCULAR | Status: DC
  Administered 2017-10-17: 1 g via INTRAVENOUS
  Filled 2017-10-17 (×2): qty 1000

## 2017-10-17 MED ORDER — LEVOFLOXACIN IN D5W 500 MG/100ML IV SOLN
500.0000 mg | Freq: Once | INTRAVENOUS | Status: AC
  Administered 2017-10-17: 500 mg via INTRAVENOUS
  Filled 2017-10-17: qty 100

## 2017-10-17 MED ORDER — SODIUM CHLORIDE 0.9 % IV BOLUS (SEPSIS)
2000.0000 mL | Freq: Once | INTRAVENOUS | Status: AC
  Administered 2017-10-17: 2000 mL via INTRAVENOUS

## 2017-10-17 MED ORDER — METOPROLOL TARTRATE 100 MG PO TABS
100.0000 mg | ORAL_TABLET | Freq: Two times a day (BID) | ORAL | Status: DC
  Administered 2017-10-19 – 2017-10-20 (×3): 100 mg via ORAL
  Filled 2017-10-17 (×2): qty 1
  Filled 2017-10-17: qty 4
  Filled 2017-10-17 (×2): qty 1

## 2017-10-17 MED ORDER — ACETAMINOPHEN 650 MG RE SUPP
650.0000 mg | Freq: Once | RECTAL | Status: AC
  Administered 2017-10-17: 650 mg via RECTAL
  Filled 2017-10-17: qty 1

## 2017-10-17 MED ORDER — HALOPERIDOL 1 MG PO TABS: 10.0000 mg | ORAL_TABLET | Freq: Every day | ORAL | Status: DC

## 2017-10-17 NOTE — ED Triage Notes (Signed)
Pt arrives to ED from home via EMS with complaints of "more altered than normal per family" since one week ago. Family states that pt can normally verbalize needs and has been increasingly deteriorating over the past week. Pt presents with indwelling foley catheter. Family states hospice nurse was consulted, hospice nurse told EMS the pt had a cough, rhonchi,, temp of 101, and glucose of 59 upon her arrival. Pt placed in position of comfort with bed locked and lowered, call bell in reach.

## 2017-10-17 NOTE — ED Provider Notes (Signed)
[facility] EMERGENCY DEPARTMENT Provider Note   CSN: [id number] Arrival date & time: 10/17/17  1448     History   Chief Complaint Chief Complaint  Patient presents with  . Altered Mental Status    Hospice     HPI [name] [name] is a 71 y.o. male.  Patient has severe renal disease.  He is a hospice patient and a DNR.  Patient did not want dialysis.  His medical power of attorney states that he became more confused today and has developed a cough.  His medical power of attorney wishes patient to get IV antibiotics for infection but does not want any aggressive treatment with central lines and pressor agents.  No intubation   The history is provided by a relative. No language interpreter was used.  Illness  This is a chronic problem. The current episode started 12 to 24 hours ago. The problem occurs constantly. The problem has not changed since onset.Pertinent negatives include no chest pain. Nothing aggravates the symptoms. Nothing relieves the symptoms.    Past Medical History:  Diagnosis Date  . Chronic kidney disease    kidney stones  . Diabetes mellitus without complication (HCC)   . GERD (gastroesophageal reflux disease)   . Hyperlipidemia   . Hypertension     Patient Active Problem List   Diagnosis Date Noted  . Acute on chronic renal failure (HCC) 02/21/2016  . Chronic abdominal pain 02/21/2016  . Transaminitis 02/21/2016  . Abdominal pain 02/21/2016  . Anemia in chronic kidney disease 02/21/2016  . Diabetes mellitus with complication (HCC)   . Acute encephalopathy 06/09/2015  . Hyperkalemia 06/07/2015  . Fever 06/07/2015  . Hypoglycemia 06/07/2015  . Acute renal failure (HCC) 06/07/2015  . HTN (hypertension) 06/07/2015  . Schizophrenia (HCC) 06/07/2015  . Symptomatic cholelithiasis 06/09/2014  . Ureteral stone with hydronephrosis 04/05/2014  . Acute renal insufficiency 04/05/2014  . Chronic renal insufficiency, stage II (mild)  04/05/2014  . Diabetes (HCC) 11/15/2013  . Hypertension 11/15/2013  . Other and unspecified hyperlipidemia 11/15/2013    Past Surgical History:  Procedure Laterality Date  . CYSTOSCOPY WITH RETROGRADE PYELOGRAM, URETEROSCOPY AND STENT PLACEMENT Left 04/05/2014   Procedure: CYSTOSCOPY WITH RETROGRADE PYELOGRAM, POSSIBLE LEFT URETEROSCOPY AND LEFT STENT PLACEMENT;  Surgeon: Bjorn Pippin, MD;  Location: [facility];  Service: Urology;  Laterality: Left;  . HOLMIUM LASER APPLICATION Left 04/05/2014   Procedure: HOLMIUM LASER APPLICATION;  Surgeon: Bjorn Pippin, MD;  Location: [facility];  Service: Urology;  Laterality: Left;  . KIDNEY SURGERY    . LITHOTRIPSY    . NO PAST SURGERIES         Home Medications    Prior to Admission medications   Medication Sig Start Date End Date Taking? Authorizing Provider  amLODipine (NORVASC) 5 MG tablet Take 5 mg by mouth daily.    Yes [provider]  aspirin 81 MG tablet Take 81 mg by mouth daily.   Yes [provider]  benztropine (COGENTIN) 1 MG tablet Take 1 tablet (1 mg total) by mouth 2 (two) times daily. 01/04/16  Yes Plovsky, Earvin Hansen, MD  [employer] SENNA PLUS 8.6-50 MG tablet Take 2 tablets by mouth 2 (two) times daily as needed.  09/24/17  Yes [provider]  docusate sodium (COLACE) 100 MG capsule Take 1 capsule (100 mg total) by mouth 2 (two) times daily. Patient taking differently: Take 100 mg by mouth 2 (two) times daily as needed for mild constipation or moderate constipation.  02/24/16  Yes Osvaldo ShipperKrishnan, Gokul, MD  haloperidol (HALDOL) 5 MG tablet Take1 tab in am, then take 2 tabs in pm 01/04/16  Yes Plovsky, Earvin HansenGerald, MD  HUMULIN N 100 UNIT/ML injection Inject 4-6 Units as directed 2 (two) times daily. 4 units in the morning and 6 units in the evening 09/21/17  Yes [provider]  hydrALAZINE (APRESOLINE) 25 MG tablet Take 1 tablet (25 mg total) by mouth 3 (three) times daily. 06/11/15  Yes Christiane HaSullivan, Corinna L, MD  hydrOXYzine  (ATARAX/VISTARIL) 25 MG tablet Take 25 mg by mouth as needed. 09/17/17  Yes [provider]  LORazepam (ATIVAN) 0.5 MG tablet Take 0.5 mg by mouth as needed. 07/19/17  Yes [provider]  metoprolol (LOPRESSOR) 100 MG tablet Take 100 mg by mouth 2 (two) times daily.   Yes [provider]  HYDROcodone-acetaminophen (NORCO/VICODIN) 5-325 MG tablet Take 1 tablet by mouth every 6 (six) hours as needed for moderate pain. Patient not taking: Reported on 10/17/2017 02/24/16   Osvaldo ShipperKrishnan, Gokul, MD  insulin detemir (LEVEMIR) 100 UNIT/ML injection Inject 0.05 mLs (5 Units total) into the skin daily. Patient not taking: Reported on 10/17/2017 02/24/16   Osvaldo ShipperKrishnan, Gokul, MD  polyethylene glycol [facility](MIRALAX / Ethelene HalGLYCOLAX) packet Take 17 g by mouth daily. Patient not taking: Reported on 10/17/2017 02/24/16   Osvaldo ShipperKrishnan, Gokul, MD    Family History Family History  Problem Relation Age of Onset  . Stroke Mother   . Heart disease Father     Social History Social History  Substance Use Topics  . Smoking status: Never Smoker  . Smokeless tobacco: Never Used  . Alcohol use No     Allergies   Bee venom   Review of Systems Review of Systems  Unable to perform ROS: Mental status change  Cardiovascular: Negative for chest pain.     Physical Exam Updated Vital Signs BP (!) 171/76 (BP Location: Right Arm)   Pulse (!) 51   Temp (!) 102.8 F (39.3 C) (Oral)   Resp 20   Ht 5\' 11"  (1.803 m) Comment: per wife  Wt 64.4 kg (142 lb)   SpO2 (!) 89%   BMI 19.80 kg/m   Physical Exam  Constitutional: He appears well-developed.  HENT:  Head: Normocephalic.  Eyes: Conjunctivae and EOM are normal. No scleral icterus.  Neck: Neck supple. No thyromegaly present.  Cardiovascular: Normal rate and regular rhythm.  Exam reveals no gallop and no friction rub.   No murmur heard. Pulmonary/Chest: No stridor. He has wheezes. He has no rales. He exhibits no tenderness.  Abdominal: He exhibits no  distension. There is no tenderness. There is no rebound.  Musculoskeletal: Normal range of motion. He exhibits no edema.  Lymphadenopathy:    He has no cervical adenopathy.  Neurological: He is alert. He exhibits normal muscle tone. Coordination normal.  Patient does not respond to verbal stimuli.  Patient is alert  Skin: No rash noted. No erythema.     ED Treatments / Results  Labs (all labs ordered are listed, but only abnormal results are displayed) Labs Reviewed  CULTURE, BLOOD (ROUTINE X 2)  CULTURE, BLOOD (ROUTINE X 2)  COMPREHENSIVE METABOLIC PANEL  CBC WITH DIFFERENTIAL/PLATELET  URINALYSIS, ROUTINE W REFLEX MICROSCOPIC  I-STAT CG4 LACTIC ACID, ED  I-STAT CHEM 8, ED    EKG  EKG Interpretation None       Radiology Dg Chest Port 1 View  Result Date: 10/17/2017 CLINICAL DATA:  Cough and altered mental status. EXAM: PORTABLE  CHEST 1 VIEW COMPARISON:  06/07/2015 and prior radiographs FINDINGS: The cardiomediastinal silhouette is unremarkable. Apparent increased density in the left retrocardiac region may represent atelectasis or airspace disease. The right lung is clear. No pleural effusion or pneumothorax identified. There are no acute bony abnormalities noted. IMPRESSION: Apparent increased density in the left retrocardiac region -question atelectasis or airspace disease. Electronically Signed   By: Harmon Pier M.D.   On: 10/17/2017 15:35    Procedures Procedures (including critical care time)  Medications Ordered in ED Medications  levofloxacin (LEVAQUIN) IVPB 500 mg (500 mg Intravenous New Bag/Given 10/17/17 1625)  acetaminophen (TYLENOL) suppository 650 mg (not administered)  sodium chloride 0.9 % bolus 2,000 mL (2,000 mLs Intravenous New Bag/Given 10/17/17 1617)     Initial Impression / Assessment and Plan / ED Course  I have reviewed the triage vital signs and the nursing notes.  Pertinent labs & imaging results that were available during my care of the  patient were reviewed by me and considered in my medical decision making (see chart for details). Patient has pneumonia.  He will be placed on antibiotics.  I spoke with his medical power of attorney and the patient's wishes are not to be intubated he is a DNR no dialysis no aggressive treatment with pressors or central line.  IV antibiotics only and comfort care      Final Clinical Impressions(s) / ED Diagnoses   Final diagnoses:  None    New Prescriptions New Prescriptions   No medications on file     Bethann Berkshire, MD 10/17/17 [id number]

## 2017-10-17 NOTE — Progress Notes (Signed)
[facility] RN Visit.  Visited with patient and family at bedside in ED. Patient was resting and did not appear in distress. Per family members the patient has been experiencing worsening cough, wheezing and temp over past few days. Family expressed they only want comfort care and antibiotics if warranted.   Patient has a temp of 102.8, BP 171/76, Pulse 106, resp 20, SpO2 89% on 2L [place].  The plan is to admit and treat with antibiotics.   Hospice will continue to follow. Please call with any hospice related questions.   Please call [facility] for transportation when patient is  discharged home.   Thank you,  Elsie SaasMary Anne Robertson RN [facility] Hospital Liaison  [phone]    All Hospital Liaisons are on AMION

## 2017-10-17 NOTE — H&P (Signed)
Family Medicine Teaching [facility] Admission History and Physical Service Pager: [phone]  Patient name: [name] [name] Medical record number: [id number] Date of birth: [birth year] Age: 71 y.o. Gender: male  Primary Care Provider: Tracey Harries, MD Consultants: None Code Status: DNR/DNI  Chief Complaint: cough  Assessment and Plan: [name] [name] is a 71 y.o. male hospice patient with ESRD, not receiving dialysis who is presenting with cough for one day. PMH is significant for ESRD (not on dialysis), T2DM, HTN, schizophrenia  Sepsis with suspected pneumonia and ? UTI Patient presented with sepsis with most likely source being a pneumonia, however also has UA highly suggestive of UTI.  Blood cultures obtained in the ED as well as urine culture.  New onset cough with fever and congestion along with chest x-ray showing possible consolidation and decreased breath sounds on the left side makes pneumonia most likely.  He did not seem uncomfortable with abdominal exam, however this does not rule out UTI.  -Admit to family medicine teaching service under attending Mcdiarmid -Levaquin 500 mg q24hrs -Ampicillin 500mg  q12hrs  -Can escalate to carbapenem if needed -Continuous pulse ox -Morphine PRN pain, comfort  ESRD Not receiving dialysis.  BUN elevated to 88 upon admission.  This is likely significantly contributing to his current level of cognition.   HTN Takes amlodipine daily. Currently having trouble with PO medication.  -continue to monitor; consider adding PRN hydralazine if increasing to urgent ranges  Schizophenia -Continue haldol and cogentin  FEN/GI: NPO Prophylaxis: subq heparin  Disposition: Hospice patient with poor prognosis  History of Present Illness:  [name] is a 71 y.o. male presenting with cough. His ex wife who lives with him and is his caretaker heard him coughing this morning and had some congestion. Called hospice nurse who came to evaluate him  and felt there was a concern for PNA and he had fever to 100.8.  Family weighed options and decided to come to hospital for IV abx.  For the past several days he has not had anything to eat or drink and he is not talking. Of note, he has been bed-bound over the past several weeks after refusing dialysis.   In ED had cxr concerning for PNA and also had UTI showing positive nitrites and is febrile. Started on levaquin and supplemental oxygen.    Review Of Systems: Per HPI with the following additions:   ROS  Patient Active Problem List   Diagnosis Date Noted  . Acute on chronic renal failure (HCC) 02/21/2016  . Chronic abdominal pain 02/21/2016  . Transaminitis 02/21/2016  . Abdominal pain 02/21/2016  . Anemia in chronic kidney disease 02/21/2016  . Diabetes mellitus with complication (HCC)   . Acute encephalopathy 06/09/2015  . Hyperkalemia 06/07/2015  . Fever 06/07/2015  . Hypoglycemia 06/07/2015  . Acute renal failure (HCC) 06/07/2015  . HTN (hypertension) 06/07/2015  . Schizophrenia (HCC) 06/07/2015  . Symptomatic cholelithiasis 06/09/2014  . Ureteral stone with hydronephrosis 04/05/2014  . Acute renal insufficiency 04/05/2014  . Chronic renal insufficiency, stage II (mild) 04/05/2014  . Diabetes (HCC) 11/15/2013  . Hypertension 11/15/2013  . Other and unspecified hyperlipidemia 11/15/2013    Past Medical History: Past Medical History:  Diagnosis Date  . Chronic kidney disease    kidney stones  . Diabetes mellitus without complication (HCC)   . GERD (gastroesophageal reflux disease)   . Hyperlipidemia   . Hypertension     Past Surgical History: Past Surgical History:  Procedure Laterality Date  .  CYSTOSCOPY WITH RETROGRADE PYELOGRAM, URETEROSCOPY AND STENT PLACEMENT Left 04/05/2014   Procedure: CYSTOSCOPY WITH RETROGRADE PYELOGRAM, POSSIBLE LEFT URETEROSCOPY AND LEFT STENT PLACEMENT;  Surgeon: Bjorn PippinJohn Wrenn, MD;  Location: [facility];  Service: Urology;  Laterality: Left;   . HOLMIUM LASER APPLICATION Left 04/05/2014   Procedure: HOLMIUM LASER APPLICATION;  Surgeon: Bjorn PippinJohn Wrenn, MD;  Location: [facility];  Service: Urology;  Laterality: Left;  . KIDNEY SURGERY    . LITHOTRIPSY    . NO PAST SURGERIES      Social History: Social History  Substance Use Topics  . Smoking status: Never Smoker  . Smokeless tobacco: Never Used  . Alcohol use No   Please also refer to relevant sections of EMR.  Family History: Family History  Problem Relation Age of Onset  . Stroke Mother   . Heart disease Father     Allergies and Medications: Allergies  Allergen Reactions  . Bee Venom Anaphylaxis   No current facility-administered medications on file prior to encounter.    Current Outpatient Prescriptions on File Prior to Encounter  Medication Sig Dispense Refill  . amLODipine (NORVASC) 5 MG tablet Take 5 mg by mouth daily.     Marland Kitchen. aspirin 81 MG tablet Take 81 mg by mouth daily.    . benztropine (COGENTIN) 1 MG tablet Take 1 tablet (1 mg total) by mouth 2 (two) times daily. 60 tablet 5  . docusate sodium (COLACE) 100 MG capsule Take 1 capsule (100 mg total) by mouth 2 (two) times daily. (Patient taking differently: Take 100 mg by mouth 2 (two) times daily as needed for mild constipation or moderate constipation. ) 10 capsule 0  . haloperidol (HALDOL) 5 MG tablet Take1 tab in am, then take 2 tabs in pm 90 tablet 5  . hydrALAZINE (APRESOLINE) 25 MG tablet Take 1 tablet (25 mg total) by mouth 3 (three) times daily.    . metoprolol (LOPRESSOR) 100 MG tablet Take 100 mg by mouth 2 (two) times daily.    Marland Kitchen. HYDROcodone-acetaminophen (NORCO/VICODIN) 5-325 MG tablet Take 1 tablet by mouth every 6 (six) hours as needed for moderate pain. (Patient not taking: Reported on 10/17/2017) 30 tablet 0  . insulin detemir (LEVEMIR) 100 UNIT/ML injection Inject 0.05 mLs (5 Units total) into the skin daily. (Patient not taking: Reported on 10/17/2017) 10 mL 11  . polyethylene glycol (MIRALAX /  GLYCOLAX) packet Take 17 g by mouth daily. (Patient not taking: Reported on 10/17/2017) 30 each 0    Objective: BP (!) 180/77   Pulse (!) 103   Temp (!) 102.8 F (39.3 C) (Oral)   Resp 17   Ht 5\' 11"  (1.803 m) Comment: per wife  Wt 142 lb (64.4 kg)   SpO2 92%   BMI 19.80 kg/m  Exam: General: [age] year old male lying in bed appearing uncomfortable, but no acute distress Eyes: Eyes closed, pupils equal round reactive to light bilaterally and noninjected and without scleral icterus ENTM: No nasal drainage, dry mucous membranes Neck: Supple neck, no lymphadenopathy Cardiovascular: Regular rate and rhythm, no apparent murmurs and no edema Respiratory: Normal work of breathing, diminished breath sounds on the left side throughout, but with poor effort.  No apparent wheezing or rhonchi Gastrointestinal: Soft, nontender, nondistended no peritoneal signs, no rebounding MSK: No gross deformities or edema. Derm: Warm and dry without rash Neuro: Patient is alert to voice, but not alert to Psych: Responds to voice and was able to answer yes or no questions, not alert or oriented  Labs  and Imaging: CBC BMET   Recent Labs Lab 10/17/17 1620 10/17/17 1655  WBC 7.8  --   HGB 9.1* 10.5*  HCT 29.0* 31.0*  PLT 134*  --     Recent Labs Lab 10/17/17 1655  NA 146*  K 5.5*  CL 118*  BUN 76*  CREATININE 12.30*  GLUCOSE 108*     Dg Chest Port 1 View  Result Date: 10/17/2017 CLINICAL DATA:  Cough and altered mental status. EXAM: PORTABLE CHEST 1 VIEW COMPARISON:  06/07/2015 and prior radiographs FINDINGS: The cardiomediastinal silhouette is unremarkable. Apparent increased density in the left retrocardiac region may represent atelectasis or airspace disease. The right lung is clear. No pleural effusion or pneumothorax identified. There are no acute bony abnormalities noted. IMPRESSION: Apparent increased density in the left retrocardiac region -question atelectasis or airspace disease.  Electronically Signed   By: Harmon Pier M.D.   On: 10/17/2017 15:35    Renne Musca, MD 10/17/2017, 5:02 PM PGY-2, [facility] FPTS Intern pager: [phone], text pages welcome

## 2017-10-18 ENCOUNTER — Encounter (HOSPITAL_COMMUNITY): Payer: Self-pay | Admitting: Family Medicine

## 2017-10-18 DIAGNOSIS — N186 End stage renal disease: Secondary | ICD-10-CM | POA: Diagnosis present

## 2017-10-18 DIAGNOSIS — G9341 Metabolic encephalopathy: Secondary | ICD-10-CM

## 2017-10-18 DIAGNOSIS — J189 Pneumonia, unspecified organism: Secondary | ICD-10-CM

## 2017-10-18 DIAGNOSIS — F209 Schizophrenia, unspecified: Secondary | ICD-10-CM

## 2017-10-18 DIAGNOSIS — Z515 Encounter for palliative care: Secondary | ICD-10-CM

## 2017-10-18 LAB — INFLUENZA PANEL BY PCR (TYPE A & B)
INFLAPCR: NEGATIVE
INFLBPCR: NEGATIVE

## 2017-10-18 MED ORDER — HALOPERIDOL LACTATE 5 MG/ML IJ SOLN
5.0000 mg | Freq: Every day | INTRAMUSCULAR | Status: DC
  Administered 2017-10-18 – 2017-10-20 (×2): 5 mg via INTRAMUSCULAR
  Filled 2017-10-18 (×3): qty 1

## 2017-10-18 MED ORDER — HALOPERIDOL 1 MG PO TABS
5.0000 mg | ORAL_TABLET | Freq: Every day | ORAL | Status: DC
  Administered 2017-10-19: 5 mg via ORAL
  Administered 2017-10-21: 4 mg via ORAL
  Filled 2017-10-18 (×2): qty 5

## 2017-10-18 MED ORDER — LEVOFLOXACIN IN D5W 500 MG/100ML IV SOLN
500.0000 mg | INTRAVENOUS | Status: DC
  Filled 2017-10-18: qty 100

## 2017-10-18 MED ORDER — HALOPERIDOL 1 MG PO TABS
10.0000 mg | ORAL_TABLET | Freq: Every day | ORAL | Status: DC
  Administered 2017-10-19: 10 mg via ORAL
  Filled 2017-10-18: qty 10

## 2017-10-18 MED ORDER — HALOPERIDOL LACTATE 5 MG/ML IJ SOLN
10.0000 mg | Freq: Every day | INTRAMUSCULAR | Status: DC
  Administered 2017-10-18: 10 mg via INTRAMUSCULAR
  Filled 2017-10-18: qty 2

## 2017-10-18 MED ORDER — LORAZEPAM 0.5 MG PO TABS: 0.5000 mg | ORAL_TABLET | Freq: Four times a day (QID) | ORAL | Status: DC | PRN

## 2017-10-18 MED ORDER — SODIUM CHLORIDE 0.9 % IV SOLN
500.0000 mg | Freq: Two times a day (BID) | INTRAVENOUS | Status: DC
  Administered 2017-10-18: 500 mg via INTRAVENOUS
  Filled 2017-10-18: qty 2

## 2017-10-18 MED ORDER — ORAL CARE MOUTH RINSE
15.0000 mL | Freq: Two times a day (BID) | OROMUCOSAL | Status: DC
  Administered 2017-10-18 – 2017-10-20 (×4): 15 mL via OROMUCOSAL

## 2017-10-18 NOTE — Progress Notes (Signed)
Unable to administer 1000 meds. Family stated that he has not been taking his medicines at home, patient was not alert enough to swallow safely. Primary nurse notified. Dequavious Harshberger, Student-RN ([employer])

## 2017-10-18 NOTE — Progress Notes (Signed)
[facility] [phone] [facility] RN Visit. @13 :00PM  This is a related and covered GIP admission of 10/17/2017 with [facility] diagnosis of ESRD per Dr Barbee ShropshireHertweck. Code status: DNR. Family contacted [facility] to report changes in patient condition. Upon assessment by visiting RN patient had coarse rhonchi throughout and fever, minimally responsive. CBG was 59. Decision was made to call EMS and transport to ED for evaluation.   Visited patient at bedside. No family present. Patient is minimally responsive, does not respond to voice but occasionally looks around. He does not appear in distress. PIVs in right forearm and left wrist. Foley catheter draining clear yellow urine. Respirations unlabored.   Medications: Levaquin 500mg  IVPB Q 48hrs. No prn meds ordered.   Dr. Barbee ShropshireHertweck and Dr. Kathrene BongoGoldsborough notified of admission. Transfer Summary and medication list placed on shadow chart.   Please call [facility] if patient needs ambulance transport at discharge.   [facility] will continue to follow while hospitalized and anticipate discharge needs.   Thank you,  Elsie SaasMary Anne Robertson RN [facility] Hospital Liaison  [phone]   All Hospital Liaisons are on AMION

## 2017-10-18 NOTE — Progress Notes (Signed)
Called patient listed home number. No answer. Will try again later to discuss goals of care.

## 2017-10-18 NOTE — Progress Notes (Signed)
Family Medicine Teaching Service Daily Progress Note Intern Pager: [phone]  Patient name: [name] [name] Medical record number: [id number] Date of birth: 03-29-1946 Age: 71 y.o. Gender: male  Primary Care Provider: Kern ReapHertweck, Donald, MD Consultants: None Code Status: DNR/DNI  Pt Overview and Major Events to Date:  [name] [name] is a 71 y.o. male hospice patient with ESRD, not receiving dialysis who is presenting with cough for one day. PMH is significant for ESRD (not on dialysis), T2DM, HTN, schizophrenia   Assessment and Plan:  Sepsis with suspected pneumonia  Mental status mildly improved. Able to answer, yes or no questions. No oriented to person or place. Respiratory status seems improved, patient not tachypnic and satting 95% on [place] O2 3 L. Normotensive to hyperetensive. Normal heart rate. Afebrile.  Chest x-ray showing possible consolidation and decreased breath sounds on the left side makes pneumonia most likely. If tolerating PO, transition abx to oral. Follow up with family to clarify treatment goals given patient is on hospice care.  -Levaquin 500 mg q24hrs -Ampicillin 500mg  q12hrs  -Continuous pulse ox -Morphine PRN pain, comfort -Consider palliative care consult - will de-escalate vitals monitoringcare due to hospice status, will assess status improvement clinically   UTI.  UA positive for nitrites and small leukocytes. Possible contributing to confusion  AMS Improved, now responds to yes/no questioning. Pt has poor baseline per report from wife. AMS likely multifactorial including uremia from ESRD, infectious (PNA vs. 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  - IM haldol per home dose  ESRD Not receiving dialysis.  BUN elevated to 88 upon admission.  This is likely significantly contributing to his current level of cognition.   HTN Takes amlodipine daily. Currently having trouble with PO medication.  -continue to monitor; - will de  Schizophenia -Continue  haldol and cogentin  FEN/GI: Full diet Prophylaxis: none, given hospice status  Disposition: Likely home, possibly hospice given family/care giver wishes  Subjective:  Able to grunt, denying pain, or discomfort. Grunts in agreement that he is feeling better.   Objective: Temp:  [97.9 F (36.6 C)-102.8 F (39.3 C)] 97.9 F (36.6 C) (11/02 0800) Pulse Rate:  [47-106] 93 (11/02 0800) Resp:  [15-22] 16 (11/02 0800) BP: (120-180)/(59-93) 130/71 (11/02 0800) SpO2:  [84 %-96 %] 94 % (11/02 0800) Weight:  [132 lb 3.2 oz (60 kg)-142 lb (64.4 kg)] 132 lb 3.2 oz (60 kg) (11/01 2247) Physical Exam: General: resting in bed, nad Cardiovascular: rrr, no mrg Respiratory: no increased work of breathing, diminished breath sounds throughout, no focal findings Abdomen: soft, nontender, nondistedmed Extremities: no lower extremity edema, 2+dp  Laboratory:  Recent Labs Lab 10/17/17 1620 10/17/17 1655 10/17/17 2245  WBC 7.8  --  10.2  HGB 9.1* 10.5* 8.6*  HCT 29.0* 31.0* 27.4*  PLT 134*  --  119*    Recent Labs Lab 10/17/17 1620 10/17/17 1655 10/17/17 2245  NA 143 146*  --   K 5.5* 5.5*  --   CL 115* 118*  --   CO2 16*  --   --   BUN 88* 76*  --   CREATININE 12.03* 12.30* 11.49*  CALCIUM 8.1*  --   --   PROT 7.5  --   --   BILITOT 0.7  --   --   ALKPHOS 98  --   --   ALT 12*  --   --   AST 15  --   --   GLUCOSE 110* 108*  --    Imaging/Diagnostic Tests:  Dg Chest Port 1 View  Result Date: 10/17/2017 CLINICAL DATA:  Cough and altered mental status. EXAM: PORTABLE CHEST 1 VIEW COMPARISON:  06/07/2015 and prior radiographs FINDINGS: The cardiomediastinal silhouette is unremarkable. Apparent increased density in the left retrocardiac region may represent atelectasis or airspace disease. The right lung is clear. No pleural effusion or pneumothorax identified. There are no acute bony abnormalities noted. IMPRESSION: Apparent increased density in the left retrocardiac region  -question atelectasis or airspace disease. Electronically Signed   By: Harmon Pier M.D.   On: 10/17/2017 15:35    Garnette Gunner, MD 10/18/2017, 9:27 AM PGY-1, [facility] FPTS Intern pager: [phone], text pages welcome

## 2017-10-18 NOTE — Care Management Note (Signed)
Case Management Note  Patient Details  Name: [name] [name] MRN: [id number] Date of Birth: [birth year]  Subjective/Objective:                    Action/Plan:   Expected Discharge Date:                  Expected Discharge Plan:  Home w Hospice Care  In-House Referral:     Discharge planning Services     Post Acute Care Choice:    Choice offered to:     DME Arranged:    DME Agency:     HH Arranged:    HH Agency:  [facility]  Status of Service:  In process, will continue to follow  If discussed at Long Length of Stay Meetings, dates discussed:    Additional Comments:  [name], [name], RN 10/18/2017, 12:56 PM

## 2017-10-18 NOTE — Progress Notes (Signed)
Concern that patient may be on additional medications not listed in our system. Specifically ativan. Per [facility], medication list was left in patient "shadow chart." I went to look for it in the paper chart, but was unable to find it. Contacted [facility] today, they are going to fax it over later today.

## 2017-10-19 NOTE — Progress Notes (Signed)
FMTS Attending Daily Note:  ** Late note: Patient examined at 930 this morning.  S: No complaints of pain this morning.  Patient is lying in bed asleep.  Eyes flutter open to voice.  Exam: As above patient is lying in bed.  He is not very alert but will respond to questions about pain or hunger with yes or no.  Otherwise eyes fluttering questions but does not respond to other questions.  Heart is regular.  Lungs with some coarse breath sounds throughout.  Abdomen appears to be nontender and is nondistended.  Impression/plan: 1.  Altered mental status: -We are treating this is secondary to pneumonia. -He is currently being transitioned to p.o. antibiotics. -Patient is a hospice patient with the family wanted reversal of treatable conditions and thus the admission for antibiotics.  2.  Questionable UTI: -Could be contributing to #1 above.  He is on antibiotics currently which will cover for any UTI.  3.  End-stage renal disease: -Patient is refused dialysis which is what makes him a hospice patient. -Uremia likely also contributing to #1 above.  4.  Disposition: -Evidently family would like patient to be [facility] when ready. -As above we are transitioning to p.o. antibiotics.  I am unclear if he will make it to discharge alive based on exam today. -If he does begin to improve with antibiotics we will [facility] when[facility]if [facility] a bed.   Tobey GrimWalden, Telsa Dillavou H, MD 10/19/2017 2:05 PM

## 2017-10-19 NOTE — Discharge Summary (Signed)
Family Medicine Teaching [facility] Discharge Summary  Patient name: [name] [name] Medical record number: [id number] Date of birth: [birth year] Age: 71 y.o. Gender: male Date of Admission: 10/17/2017  Date of Discharge: 10/21/2017 Admitting Physician: Carney LivingMarshall L Chambliss, MD  Primary Care Provider: Kern ReapHertweck, Donald, MD Consultants: None  Indication for Hospitalization: PNA associated with AMS  Discharge Diagnoses/Problem List:  Altered Mental Status Pneumonia Urinary Tract Infection ESRD Hypertension Schitzophrenia Type 2 Diabetes Melitis  Disposition: Hospice  Discharge Condition: Declining  Discharge Exam:  General:sitting in bed. Pointed to right knee when asked if he was in pain. No other response to yes/no questioning. Actively  Cardiovascular: RRR, no murmurs Respiratory: decreased breath sounds throughout all lung fields, normal work of breathing Abdomen: soft, nontender, nondiseased MSK: normal appearing right knee with no evidence of edema or lesion Neuro: nods "yes" or "no" in response to questions, otherwise does not speak.    Brief Hospital Course:  [name] RhineFrancis B McCoyis a 71 y.o.malehospice patient with untreated ESRD, not receiving dialysis who is presenting with declining mental status for several weeks and cough for one day. PMH is significant for ESRD (not on dialysis), T2DM, HTN, schizophrenia. Patient presented febrile at 102. Chest xray showed questionable atelectasis. UA was consistent mild UTI. BUN 80s and Cr 11 constant with untreated ESRD. He was brought in for treatment for pneumonia and UTI with antibiotics. Patient was minimally responsive. Given patient was hospice care and family wishes minimally invasive, patient was started on oral antibiotics of levofloxacin and ampicillin. His home medications were also continued. During his hospitalization, patient did not tolerate PO and therefore did not take any of his oral medications. Patient mental  status continued to deteriate. He did not tolerate any PO medications or food. After speaking with the family and hospice, it was decided to stop antibiotic treatment and other active disease processes. He was also transitioned to comfort care and to [facility] as a bed was available.   Issues for Follow Up:  1. Comfort care measures (pain, psychological discomfort) 2. Discontinue medications for active disease processes of DM, HTN, etc.  Significant Procedures: None  Significant Labs and Imaging:  Recent Labs  Lab 10/17/17 1620 10/17/17 1655 10/17/17 2245  WBC 7.8  --  10.2  HGB 9.1* 10.5* 8.6*  HCT 29.0* 31.0* 27.4*  PLT 134*  --  119*   Recent Labs  Lab 10/17/17 1620 10/17/17 1655 10/17/17 2245  NA 143 146*  --   K 5.5* 5.5*  --   CL 115* 118*  --   CO2 16*  --   --   GLUCOSE 110* 108*  --   BUN 88* 76*  --   CREATININE 12.03* 12.30* 11.49*  CALCIUM 8.1*  --   --   ALKPHOS 98  --   --   AST 15  --   --   ALT 12*  --   --   ALBUMIN 2.7*  --   --       Results/Tests Pending at Time of Discharge:   Discharge Medications:  Allergies as of 10/21/2017      Reactions   Bee Venom Anaphylaxis      Medication List    STOP taking these medications   amLODipine 5 MG tablet Commonly known as:  NORVASC   aspirin 81 MG tablet   HUMULIN N 100 UNIT/ML injection Generic drug:  insulin NPH Human   hydrALAZINE 25 MG tablet Commonly known as:  APRESOLINE   insulin detemir 100 UNIT/ML injection Commonly known as:  LEVEMIR   metoprolol tartrate 100 MG tablet Commonly known as:  LOPRESSOR     TAKE these medications   benztropine 1 MG tablet Commonly known as:  COGENTIN Take 1 tablet (1 mg total) by mouth 2 (two) times daily.   [employer] SENNA PLUS 8.6-50 MG tablet Generic drug:  senna-docusate Take 2 tablets by mouth 2 (two) times daily as needed.   docusate sodium 100 MG capsule Commonly known as:  COLACE Take 1 capsule (100 mg total) by  mouth 2 (two) times daily. What changed:    when to take this  reasons to take this   haloperidol 5 MG tablet Commonly known as:  HALDOL Take1 tab in am, then take 2 tabs in pm   HYDROcodone-acetaminophen 5-325 MG tablet Commonly known as:  NORCO/VICODIN Take 1 tablet by mouth every 6 (six) hours as needed for moderate pain.   hydrOXYzine 25 MG tablet Commonly known as:  ATARAX/VISTARIL Take 25 mg by mouth as needed.   LORazepam 0.5 MG tablet Commonly known as:  ATIVAN Take 0.5 mg by mouth as needed.   morphine 4 MG/ML injection Inject 0.25 mLs (1 mg total) every hour as needed into the vein (comfort, dyspnea).   ondansetron 4 MG/2ML Soln injection Commonly known as:  ZOFRAN Inject 2 mLs (4 mg total) every 6 (six) hours as needed into the vein for nausea or vomiting.   polyethylene glycol packet Commonly known as:  MIRALAX / GLYCOLAX Take 17 g by mouth daily.       Discharge Instructions: Please refer to Patient Instructions section of EMR for full details.  Patient was counseled important signs and symptoms that should prompt return to medical care, changes in medications, dietary instructions, activity restrictions, and follow up appointments.   Follow-Up Appointments:   Garnette Gunner, MD 10/21/2017, 10:30 AM PGY-1, [facility]

## 2017-10-19 NOTE — Progress Notes (Signed)
FPTS Interim Progress Note  S:Patient is awake, can respond yes/no to questions. He is not eating or drinking or taking any medications by mouth. Not oriented to situation. Spoke with HCPOA, Ecolaborma Mable, on the phone. She wishes for [name] to [facility]. In order for [facility] to [facility] him, he cannot be actively treated for an infection. I told this to [place], and she agreed to stop pursuing antibiotic coverage. I also spoke with Haynes Bastracy Ennis with [facility] and I discussed the family wishes to stop care. She said that she would start the process to transfer [name] [name] [facility] as soon as there is a bed available. She said he was first in line as he is already established with [facility].   O: BP (!) 181/71 (BP Location: Left Arm)   Pulse (!) 103   Temp 98.5 F (36.9 C) (Axillary)   Resp (!) 22   Ht 5\' 11"  (1.803 m) Comment: per wife  Wt 132 lb 3.2 oz (60 kg)   SpO2 98%   BMI 18.44 kg/m     A/P: [name] [name] is near end of life due to ESRD. Given that he his AMS has mildy improved despite abx, and after discussion with family. I will stop abx. I will continue his home medications and provide comfort care as needed until he is ready for transfer.   Garnette Gunnerhompson, Shonda Mandarino B, MD 10/19/2017, 3:38 PM PGY-1, [facility] Service pager [phone]

## 2017-10-19 NOTE — Progress Notes (Signed)
Family Medicine Teaching Service Daily Progress Note Intern Pager: [phone]  Patient name: [name] [name] Medical record number: [id number] Date of birth: December 22, 1945 Age: 71 y.o. Gender: male  Primary Care Provider: Kern Reap, MD Consultants: None Code Status: DNR/DNI  Pt Overview and Major Events to Date:  [name] [name] is a 71 y.o. male hospice patient with ESRD, not receiving dialysis who is presenting with cough for one day. PMH is significant for ESRD (not on dialysis), T2DM, HTN, schizophrenia   Assessment and Plan:  AMS Improved, now mildly verbalizing. Not endorsing pain. Oriented to self, not place/situation. Likely multifactorial including uremia from ESRD, infectious (PNA vs. 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 Endorsed feeling hungry this morning, will hopefully tolerate PO today including abx. - cont home haldol and ativan  Sepsis with suspected pneumonia, improved? Deminished breath sounds, but no increased work of breathing. No focal findings. Treating with oral abx, but pt has not been tolerating PO well.  Not pursuing IV due to hospice status. Spoke with [facility] of [facility], they said pt qualified for [facility] based on ESRD status, but they will not take him iif he is getting active treatment for his PNA. Plan to call family today to clarify goals of care. -Levaquin 500 mg q24hrs -Ampicillin 500mg  q12hrs  -Morphine PRN pain, comfort  UTI.  UA positive for nitrites and small leukocytes. Possible contributing to confusion - cont abx  ESRD Not receiving dialysis.  BUN elevated to 88 upon admission.  This is likely significantly contributing to his current level of cognition.   HTN Takes amlodipine daily. Currently having trouble with PO medication.  -continue to monitor; - will de  Schizophenia -Continue haldol and cogentin  FEN/GI: Full diet Prophylaxis: none, given hospice status  Disposition: Likely hospice, pending family/care giver  wishes  Subjective: Verbalizing mildy today and following commands (sat up for me to perform lung exam). Denies any pain. Responds to name, appears oriented to self.   Objective: Temp:  [97.8 F (36.6 C)-98 F (36.7 C)] 97.8 F (36.6 C) (11/03 0518) Pulse Rate:  [94-103] 103 (11/03 0518) Resp:  [20] 20 (11/03 0518) BP: (142-152)/(60-61) 152/61 (11/03 0518) SpO2:  [94 %-96 %] 96 % (11/03 0518) Physical Exam: General: resting in bed, nad Cardiovascular: rrr, no mrg Respiratory: no increased work of breathing, diminished breath sounds throughout, no focal findings Abdomen: soft, nontender, nondistedmed Extremities: no lower extremity edema, 2+dp Neuro: Improve mental status, AxO to self. Verbalizing mildy today and following commands (sat up for me to perform lung exam).  Laboratory:  Recent Labs Lab 10/17/17 1620 10/17/17 1655 10/17/17 2245  WBC 7.8  --  10.2  HGB 9.1* 10.5* 8.6*  HCT 29.0* 31.0* 27.4*  PLT 134*  --  119*    Recent Labs Lab 10/17/17 1620 10/17/17 1655 10/17/17 2245  NA 143 146*  --   K 5.5* 5.5*  --   CL 115* 118*  --   CO2 16*  --   --   BUN 88* 76*  --   CREATININE 12.03* 12.30* 11.49*  CALCIUM 8.1*  --   --   PROT 7.5  --   --   BILITOT 0.7  --   --   ALKPHOS 98  --   --   ALT 12*  --   --   AST 15  --   --   GLUCOSE 110* 108*  --    Imaging/Diagnostic Tests: Dg Chest Port 1 View  Result Date: 10/17/2017  CLINICAL DATA:  Cough and altered mental status. EXAM: PORTABLE CHEST 1 VIEW COMPARISON:  06/07/2015 and prior radiographs FINDINGS: The cardiomediastinal silhouette is unremarkable. Apparent increased density in the left retrocardiac region may represent atelectasis or airspace disease. The right lung is clear. No pleural effusion or pneumothorax identified. There are no acute bony abnormalities noted. IMPRESSION: Apparent increased density in the left retrocardiac region -question atelectasis or airspace disease. Electronically Signed   By:  Harmon PierJeffrey  Hu M.D.   On: 10/17/2017 15:35    Garnette Gunnerhompson, Aaron B, MD 10/19/2017, 2:12 PM PGY-1, [facility] FPTS Intern pager: [phone], text pages welcome

## 2017-10-19 NOTE — Progress Notes (Signed)
Called family to discus GOC. Left VM to call back at [phone]

## 2017-10-19 NOTE — Clinical Social Work Note (Signed)
CSW acknowledges consult "hospice." Per notes, plan so far is home with hospice. CSW will continue to follow in case patient needs hospice facility.  Charlynn CourtSarah Vallie Teters, CSW [phone]

## 2017-10-19 NOTE — Progress Notes (Signed)
[facility] [id number] [facility] - RN Visit at 1445  This is a related and covered GIP admission of 10/17/2017 with [facility] diagnosis of ESRD per Dr Barbee ShropshireHertweck. Code status: DNR. Family contacted [facility] to report changes in patient condition. Upon assessment by visiting RN patient had coarse rhonchi throughout and fever, minimally responsive. CBG was 59. Decision was made to call EMS and transport to ED for evaluation.  Visited patient at bedside. Sister and niece from [place] are at bedside. Patient does respond to name and denies having pain but then does not answer other questions. He appears comfortable and without distress or increased work of breathing. He appears to have 1/2 of a tablet on his chest which was reported to RN. Rosanna RandyBernadita, bedside RN states she did try to feed him some ice cream this morning. She states it took him a long time to swallow the bites.  PIV's in place in right forearm and left wrist. Foley catheter in place but no urine output in bag or tubing. O2 by [place] at 3 lpm.  Pt is receiving levofloxacin (LEVAQUIN) IVPB 500 mg Q 48 hours. He has not received any prn medications.  Dr. Janee Mornhompson did call me this morning to inquire if patient might be appropriate for [facility] and if antibiotics could be continued. Discussion needs to take place with family if it is their desire for comfort care and transfer to [facility], then antibiotics would be discontinued.  [facility].   Will continue to follow patient while hospitalized and anticipate discharge needs.  Please call [facility] for transport and time of discharge as pt is currently [facility] patient.  Please call with any hospice related questions or concerns.  Thank you, Haynes Bastracy Ennis, RN, BSN [facility] Hospital Liaison  [phone]  [facility] Hospital Liaisons are on AMION.

## 2017-10-20 ENCOUNTER — Other Ambulatory Visit: Payer: Self-pay

## 2017-10-20 ENCOUNTER — Encounter (HOSPITAL_COMMUNITY): Payer: Self-pay | Admitting: *Deleted

## 2017-10-20 MED ORDER — LORAZEPAM 2 MG/ML IJ SOLN: 0.5000 mg | Freq: Four times a day (QID) | INTRAMUSCULAR | Status: DC | PRN

## 2017-10-20 MED ORDER — MORPHINE SULFATE (PF) 4 MG/ML IV SOLN
1.0000 mg | INTRAVENOUS | Status: DC | PRN
  Administered 2017-10-21: 1 mg via INTRAVENOUS
  Filled 2017-10-20: qty 1

## 2017-10-20 MED ORDER — ONDANSETRON HCL 4 MG/2ML IJ SOLN
4.0000 mg | Freq: Four times a day (QID) | INTRAMUSCULAR | Status: DC | PRN
  Administered 2017-10-20: 4 mg via INTRAVENOUS
  Filled 2017-10-20: qty 2

## 2017-10-20 NOTE — Progress Notes (Signed)
Family Medicine Teaching Service Daily Progress Note Intern Pager: [phone]  Patient name: [name] [name] Medical record number: [id number] Date of birth: 1946/06/25 Age: 71 y.o. Gender: male  Primary Care Provider: Kern Reap, MD Consultants: None Code Status: DNR/DNI  Pt Overview and Major Events to Date:  [name] [name] is a 71 y.o. male hospice patient with ESRD, not receiving dialysis who is presenting with cough for one day. PMH is significant for ESRD (not on dialysis), T2DM, HTN, schizophrenia  Assessment and Plan:  AMS Patient actively dying. Answering yes or no, but not verbal this morning. Not endorsing pain.  - cont home haldol - switch ativan from PO to IV  Sepsis due to suspected pneumonia. Antibiotics have been stopped. -Morphine PRN pain, comfort  Emesis: Patient laying on his side with vomit in his mouth and pillow. Nods his head "yes" when I ask him if his stomach is upset. -Add Zofran IV prn  UTI.  UA positive for nitrites and small leukocytes. Possible contributing to confusion - Antibiotics have been stopped  ESRD Not receiving dialysis.  BUN elevated to 88 upon admission.  This is likely significantly contributing to his current level of cognition.   HTN -Stop all BP medications -Stop checking routine vitals  Schizophenia -Continue haldol and cogentin  FEN/GI: Full diet Prophylaxis: none, given hospice status  Disposition: Likely residential hospice  Subjective: Patient shakes his head "no" when I ask him if he is in pain. He shakes his head "yes" when I ask him if he is sick to his stomach. Otherwise, does not answer questions.  Objective: Temp:  [98.5 F (36.9 C)-100.6 F (38.1 C)] 100.6 F (38.1 C) (11/03 2033) Pulse Rate:  [103-108] 108 (11/03 2033) Resp:  [22] 22 (11/03 2033) BP: (159-181)/(71-82) 159/82 (11/03 2033) SpO2:  [98 %-100 %] 100 % (11/03 2033) Physical Exam: General: laying on his side, vomit present on  his pillow, opens eyes to voice Cardiovascular: RRR, no murmurs Respiratory: decreased breath sounds throughout all lung fields, normal work of breathing Abdomen: soft, nontender, nondistended Neuro: nods "yes" or "no" in response to questions, otherwise does not speak.  Laboratory: Recent Labs  Lab 10/17/17 1620 10/17/17 1655 10/17/17 2245  WBC 7.8  --  10.2  HGB 9.1* 10.5* 8.6*  HCT 29.0* 31.0* 27.4*  PLT 134*  --  119*   Recent Labs  Lab 10/17/17 1620 10/17/17 1655 10/17/17 2245  NA 143 146*  --   K 5.5* 5.5*  --   CL 115* 118*  --   CO2 16*  --   --   BUN 88* 76*  --   CREATININE 12.03* 12.30* 11.49*  CALCIUM 8.1*  --   --   PROT 7.5  --   --   BILITOT 0.7  --   --   ALKPHOS 98  --   --   ALT 12*  --   --   AST 15  --   --   GLUCOSE 110* 108*  --    Imaging/Diagnostic Tests: Dg Chest Port 1 View  Result Date: 10/17/2017 CLINICAL DATA:  Cough and altered mental status. EXAM: PORTABLE CHEST 1 VIEW COMPARISON:  06/07/2015 and prior radiographs FINDINGS: The cardiomediastinal silhouette is unremarkable. Apparent increased density in the left retrocardiac region may represent atelectasis or airspace disease. The right lung is clear. No pleural effusion or pneumothorax identified. There are no acute bony abnormalities noted. IMPRESSION: Apparent increased density in the left retrocardiac region -question atelectasis or  airspace disease. Electronically Signed   By: Harmon PierJeffrey  Hu M.D.   On: 10/17/2017 15:35    Mayo, Allyn KennerKaty Dodd, MD 10/20/2017, 9:44 AM PGY-3, [place] FPTS Intern pager: [phone], text pages welcome

## 2017-10-20 NOTE — Progress Notes (Signed)
[facility] 6N03[facility] - RN Visit at [phone]  This is a related and covered GIP admission of 11/01/2018with [facility] diagnosis of ESRD per Dr [name] [name]. Code status: DNR. Family contacted [facility] to report changes in patient condition. Upon assessment by visiting RN patient had coarse rhonchi throughout and fever, minimally responsive. CBG was 59. Decision was made to call EMS and transport to ED for evaluation.  Visited with patient at bedside. No family is present today. [name] does respond to voice and states he is not having pain but is unable to answer other questions. He does not focus, eyes fluttering.  PIV's in place in right forearm and left wrist. Foley catheter in place with small amount of clear yellow urine. O2 by [place] at 3 lpm.  [name] is not receiving any continuous medications at this time. He has not received any prn medications.  Antibiotics were stopped yesterday after discussion with [name] [name], pt's PCG and ex-wife and Dr. Janee [name].   I also had a long discussion with [name] [name] and son at bedside last evening. [name] [name] stated the reason she wished for him to come to the hospital was because at home he sounded so congested and she felt he was not comfortable. We discussed reasons why a person may sound congested and that it really is not painful for the patient but difficult for others to listen to. [name] [name] states the pt's urine output had stopped 3 days prior to admission and his decreasing interaction, eating only a few bites. We talked about worsening renal function and how that can impact mental status. [name] [name] and son both wish for patient to be comfortable, they do not wish to continue antibiotics and would like to proceed with [facility] when a bed becomes available.   [facility]. I have phoned [name] [name] and notified her this morning. [facility] liaison will follow up with CSW and  family tomorrow or sooner if room becomes available.  Will continue to follow patient while hospitalized and anticipate discharge needs.  Please call [facility] for transport and time of discharge as pt is currently [facility] patient.  Please do not hesitate to call with any hospice related questions or concerns.  Thank you, [name] Bastracy Ennis, RN, BSN [facility] Hospital Liaison [phone]  [facility]ia[facility] are on [facility].

## 2017-10-21 MED ORDER — MORPHINE SULFATE (PF) 4 MG/ML IV SOLN: 1.0000 mg | INTRAVENOUS | 0 refills | Status: AC | PRN

## 2017-10-21 MED ORDER — ONDANSETRON HCL 4 MG/2ML IJ SOLN: 4.0000 mg | Freq: Four times a day (QID) | INTRAMUSCULAR | 0 refills | Status: AC | PRN

## 2017-10-21 NOTE — Progress Notes (Signed)
[facility] Social Work (SW) note:   SW reviewed chart, Pt was in bed, not awake, appeared comfortable and in no distress during SW visit. This SW did call and [facility] regarding bed availability for Pt there, [facility] is figuring out who/when they will be able to admit. This SW did call and speak to [name] this morning, made her aware that still waiting to hear when [facility] bed will be available for Pt. SW will continue to follow for support/education during this hospital stay. Please contact [facility] with any needs or questions [phone]. Lorain ChildesMolly Lyle, LCSW

## 2017-10-21 NOTE — Progress Notes (Signed)
Patient took meds in applesauce all except one pill and drank some water. Not too long after he threw up, I did not see any visible pills in emesis but unsure of how much medicine he really took in. Patient comfortable now. Will continue to monitor.

## 2017-10-21 NOTE — Progress Notes (Signed)
[facility] Note:  Plan is for [facility] to [facility] today.  Spoke to CSW, Marisue IvanLiz who plans to contact [facility] for transport.  Hospice SW, BrushyMolly updating family at this time.  Please fax discharge summary to [phone]. ? RN please call report to [phone].  Please feel free to contact with any questions. ? Thank You, Hessie KnowsStacie Wilkinson RN, [facility] Hospital Liaison [phone]

## 2017-10-21 NOTE — Progress Notes (Signed)
[facility] Anticipated discharge date: 10/21/17 Family notified: Yes, by phone Transportation by: [facility]  CSW signing off.  Blenda Nicelylizabeth Cheray Pardi LCSW [phone]

## 2017-10-21 NOTE — Clinical Social Work Note (Signed)
Clinical Social Work Assessment  Patient Details  Name: [name] [name] MRN: [id number] Date of Birth: 1946-09-18  Date of referral:  10/21/17               Reason for consult:  Facility Placement, End of Life/Hospice                Permission sought to share information with:  Facility Medical sales representativeContact Representative, Family Supports Permission granted to share information::  Yes, Verbal Permission Granted  Name::     Jacob MooresNarble, Carlos  Agency[facility]  [facility]  Relationship::  Ex-wife, Son  SolicitorContact Information:     Housing/Transportation Living arrangements for the past 2 months:  Single Family Home Source of Information:  Spouse Patient Interpreter Needed:  None Criminal Activity/Legal Involvement Pertinent to Current Situation/Hospitalization:  No - Comment as needed Significant Relationships:  Adult Children, Spouse Lives with:  Self Do you feel safe going back to the place where you live?  Yes Need for family participation in patient care:  Yes (Comment)(patient at end of life)  Care giving concerns:  Patient is currently at end of life and [facility].   Social Worker assessment / plan:  CSW introduced self to patient's ex-wife, Dayna Ramusarble, over the phone to discuss placement in residential hospice. CSW explained that everything was complete and the patient was ready to transition to residential hospice facility. CSW indicated that transport would be set up soon.  Employment status:  Retired Database administratornsurance information:  Managed Medicare PT Recommendations:  Not assessed at this time Information / Referral to community resources:     Patient/Family's Response to care:  Patient's response unable to be assessed. Patient's ex-wife is glad [facility] and is agreeable to admit.  Patient/Family's Understanding of and Emotional Response to Diagnosis, Current Treatment, and Prognosis:  Patient's response unable to be  determined. Patient's ex-wife is aware that the patient is at end of life, and he has been receiving hospice at home prior to admission. Patient's ex-wife and son are at peace with the decision to pursue end of life care for the patient.   Emotional Assessment Appearance:  Appears stated age Attitude/Demeanor/Rapport:  Unable to Assess Affect (typically observed):  Unable to Assess Orientation:  (unable to assess) Alcohol / Substance use:  Not Applicable Psych involvement (Current and /or in the community):  No (Comment)  Discharge Needs  Concerns to be addressed:  Care Coordination Readmission within the last 30 days:  No Current discharge risk:  Terminally ill Barriers to Discharge:  No Barriers Identified   Baldemar Lenislizabeth M Crystel Demarco, LCSW 10/21/2017, 11:39 AM

## 2017-10-21 NOTE — Progress Notes (Signed)
[facility] Report called to facility. Social worker notified family of transfer. Patient stable upon d/c , left in peripheral IV's and condom cath and I notified RN receiving patient that those were still in. All belongings with patient.

## 2017-10-22 LAB — CULTURE, BLOOD (ROUTINE X 2)
CULTURE: NO GROWTH
Culture: NO GROWTH
SPECIAL REQUESTS: ADEQUATE
Special Requests: ADEQUATE

## 2017-11-16 DEATH — deceased
